# Patient Record
Sex: Male | Born: 1975 | Race: White | Hispanic: No | Marital: Single
Health system: Southern US, Community
[De-identification: ages and names within clinical notes are randomized; demographics above are authoritative.]

## PROBLEM LIST (undated history)

## (undated) ENCOUNTER — Emergency Department (HOSPITAL_COMMUNITY): Discharge status: Against Medical Advice | Payer: Medicaid Other

## (undated) DIAGNOSIS — R569 Unspecified convulsions: Secondary | ICD-10-CM

## (undated) DIAGNOSIS — E079 Disorder of thyroid, unspecified: Secondary | ICD-10-CM

## (undated) DIAGNOSIS — E785 Hyperlipidemia, unspecified: Secondary | ICD-10-CM

## (undated) DIAGNOSIS — M25519 Pain in unspecified shoulder: Secondary | ICD-10-CM

## (undated) DIAGNOSIS — F419 Anxiety disorder, unspecified: Secondary | ICD-10-CM

## (undated) DIAGNOSIS — M549 Dorsalgia, unspecified: Secondary | ICD-10-CM

## (undated) DIAGNOSIS — G8929 Other chronic pain: Secondary | ICD-10-CM

## (undated) DIAGNOSIS — F1910 Other psychoactive substance abuse, uncomplicated: Secondary | ICD-10-CM

## (undated) DIAGNOSIS — Z765 Malingerer [conscious simulation]: Secondary | ICD-10-CM

## (undated) DIAGNOSIS — E039 Hypothyroidism, unspecified: Secondary | ICD-10-CM

## (undated) DIAGNOSIS — F445 Conversion disorder with seizures or convulsions: Secondary | ICD-10-CM

## (undated) DIAGNOSIS — Z9289 Personal history of other medical treatment: Secondary | ICD-10-CM

## (undated) DIAGNOSIS — G40909 Epilepsy, unspecified, not intractable, without status epilepticus: Secondary | ICD-10-CM

---

## 1997-12-17 ENCOUNTER — Emergency Department (HOSPITAL_COMMUNITY): Admission: EM | Admit: 1997-12-17 | Discharge: 1997-12-17 | Payer: Self-pay | Admitting: Emergency Medicine

## 1997-12-17 ENCOUNTER — Inpatient Hospital Stay (HOSPITAL_COMMUNITY): Admission: EM | Admit: 1997-12-17 | Discharge: 1997-12-17 | Payer: Self-pay | Admitting: Emergency Medicine

## 1999-12-21 ENCOUNTER — Emergency Department (HOSPITAL_COMMUNITY): Admission: EM | Admit: 1999-12-21 | Discharge: 1999-12-21 | Payer: Self-pay | Admitting: Emergency Medicine

## 2000-01-27 ENCOUNTER — Encounter: Payer: Self-pay | Admitting: *Deleted

## 2000-01-27 ENCOUNTER — Inpatient Hospital Stay (HOSPITAL_COMMUNITY): Admission: EM | Admit: 2000-01-27 | Discharge: 2000-01-28 | Payer: Self-pay | Admitting: *Deleted

## 2000-02-24 ENCOUNTER — Emergency Department (HOSPITAL_COMMUNITY): Admission: EM | Admit: 2000-02-24 | Discharge: 2000-02-24 | Payer: Self-pay | Admitting: Emergency Medicine

## 2000-02-25 ENCOUNTER — Emergency Department (HOSPITAL_COMMUNITY): Admission: EM | Admit: 2000-02-25 | Discharge: 2000-02-25 | Payer: Self-pay | Admitting: Emergency Medicine

## 2000-02-25 ENCOUNTER — Encounter: Payer: Self-pay | Admitting: Emergency Medicine

## 2000-02-26 ENCOUNTER — Emergency Department (HOSPITAL_COMMUNITY): Admission: EM | Admit: 2000-02-26 | Discharge: 2000-02-26 | Payer: Self-pay | Admitting: Emergency Medicine

## 2000-02-27 ENCOUNTER — Emergency Department (HOSPITAL_COMMUNITY): Admission: EM | Admit: 2000-02-27 | Discharge: 2000-02-27 | Payer: Self-pay | Admitting: Emergency Medicine

## 2000-02-27 ENCOUNTER — Encounter: Payer: Self-pay | Admitting: Emergency Medicine

## 2000-03-12 ENCOUNTER — Inpatient Hospital Stay (HOSPITAL_COMMUNITY): Admission: EM | Admit: 2000-03-12 | Discharge: 2000-03-15 | Payer: Self-pay | Admitting: Emergency Medicine

## 2000-03-12 ENCOUNTER — Encounter: Payer: Self-pay | Admitting: Emergency Medicine

## 2000-03-13 ENCOUNTER — Encounter: Payer: Self-pay | Admitting: Internal Medicine

## 2000-03-15 ENCOUNTER — Emergency Department (HOSPITAL_COMMUNITY): Admission: EM | Admit: 2000-03-15 | Discharge: 2000-03-16 | Payer: Self-pay | Admitting: Emergency Medicine

## 2000-03-15 ENCOUNTER — Emergency Department (HOSPITAL_COMMUNITY): Admission: EM | Admit: 2000-03-15 | Discharge: 2000-03-15 | Payer: Self-pay | Admitting: Emergency Medicine

## 2000-03-16 ENCOUNTER — Emergency Department (HOSPITAL_COMMUNITY): Admission: EM | Admit: 2000-03-16 | Discharge: 2000-03-16 | Payer: Self-pay | Admitting: Emergency Medicine

## 2000-03-17 ENCOUNTER — Emergency Department (HOSPITAL_COMMUNITY): Admission: EM | Admit: 2000-03-17 | Discharge: 2000-03-17 | Payer: Self-pay | Admitting: Internal Medicine

## 2000-03-20 ENCOUNTER — Inpatient Hospital Stay (HOSPITAL_COMMUNITY): Admission: EM | Admit: 2000-03-20 | Discharge: 2000-03-22 | Payer: Self-pay | Admitting: Emergency Medicine

## 2000-03-20 ENCOUNTER — Emergency Department (HOSPITAL_COMMUNITY): Admission: EM | Admit: 2000-03-20 | Discharge: 2000-03-20 | Payer: Self-pay | Admitting: Emergency Medicine

## 2000-03-22 ENCOUNTER — Emergency Department (HOSPITAL_COMMUNITY): Admission: EM | Admit: 2000-03-22 | Discharge: 2000-03-22 | Payer: Self-pay | Admitting: Emergency Medicine

## 2000-04-04 ENCOUNTER — Emergency Department (HOSPITAL_COMMUNITY): Admission: EM | Admit: 2000-04-04 | Discharge: 2000-04-04 | Payer: Self-pay | Admitting: Emergency Medicine

## 2000-04-29 ENCOUNTER — Emergency Department (HOSPITAL_COMMUNITY): Admission: EM | Admit: 2000-04-29 | Discharge: 2000-04-30 | Payer: Self-pay | Admitting: Emergency Medicine

## 2001-02-20 ENCOUNTER — Encounter: Payer: Self-pay | Admitting: Emergency Medicine

## 2001-02-20 ENCOUNTER — Emergency Department (HOSPITAL_COMMUNITY): Admission: EM | Admit: 2001-02-20 | Discharge: 2001-02-20 | Payer: Self-pay | Admitting: Emergency Medicine

## 2001-04-07 ENCOUNTER — Emergency Department (HOSPITAL_COMMUNITY): Admission: EM | Admit: 2001-04-07 | Discharge: 2001-04-07 | Payer: Self-pay | Admitting: Emergency Medicine

## 2001-04-07 ENCOUNTER — Encounter: Payer: Self-pay | Admitting: Emergency Medicine

## 2002-04-10 ENCOUNTER — Emergency Department (HOSPITAL_COMMUNITY): Admission: EM | Admit: 2002-04-10 | Discharge: 2002-04-10 | Payer: Self-pay | Admitting: Emergency Medicine

## 2002-06-11 ENCOUNTER — Emergency Department (HOSPITAL_COMMUNITY): Admission: EM | Admit: 2002-06-11 | Discharge: 2002-06-11 | Payer: Self-pay | Admitting: Emergency Medicine

## 2002-06-11 ENCOUNTER — Encounter: Payer: Self-pay | Admitting: Emergency Medicine

## 2003-05-21 ENCOUNTER — Inpatient Hospital Stay (HOSPITAL_COMMUNITY): Admission: EM | Admit: 2003-05-21 | Discharge: 2003-05-22 | Payer: Self-pay | Admitting: Psychiatry

## 2003-05-22 ENCOUNTER — Inpatient Hospital Stay (HOSPITAL_COMMUNITY): Admission: EM | Admit: 2003-05-22 | Discharge: 2003-05-23 | Payer: Self-pay | Admitting: Emergency Medicine

## 2003-05-23 ENCOUNTER — Observation Stay (HOSPITAL_COMMUNITY): Admission: EM | Admit: 2003-05-23 | Discharge: 2003-05-23 | Payer: Self-pay | Admitting: Psychiatry

## 2003-05-29 ENCOUNTER — Emergency Department (HOSPITAL_COMMUNITY): Admission: EM | Admit: 2003-05-29 | Discharge: 2003-05-29 | Payer: Self-pay | Admitting: Emergency Medicine

## 2004-03-05 ENCOUNTER — Emergency Department (HOSPITAL_COMMUNITY): Admission: EM | Admit: 2004-03-05 | Discharge: 2004-03-06 | Payer: Self-pay | Admitting: Emergency Medicine

## 2004-03-26 ENCOUNTER — Emergency Department (HOSPITAL_COMMUNITY): Admission: EM | Admit: 2004-03-26 | Discharge: 2004-03-26 | Payer: Self-pay

## 2004-04-06 ENCOUNTER — Emergency Department (HOSPITAL_COMMUNITY): Admission: EM | Admit: 2004-04-06 | Discharge: 2004-04-06 | Payer: Self-pay | Admitting: Emergency Medicine

## 2004-04-19 ENCOUNTER — Emergency Department (HOSPITAL_COMMUNITY): Admission: EM | Admit: 2004-04-19 | Discharge: 2004-04-19 | Payer: Self-pay | Admitting: Emergency Medicine

## 2004-04-28 ENCOUNTER — Emergency Department (HOSPITAL_COMMUNITY): Admission: EM | Admit: 2004-04-28 | Discharge: 2004-04-28 | Payer: Self-pay | Admitting: Emergency Medicine

## 2005-12-02 ENCOUNTER — Emergency Department (HOSPITAL_COMMUNITY): Admission: EM | Admit: 2005-12-02 | Discharge: 2005-12-02 | Payer: Self-pay | Admitting: Emergency Medicine

## 2005-12-16 ENCOUNTER — Emergency Department (HOSPITAL_COMMUNITY): Admission: EM | Admit: 2005-12-16 | Discharge: 2005-12-16 | Payer: Self-pay

## 2006-01-27 ENCOUNTER — Emergency Department (HOSPITAL_COMMUNITY): Admission: EM | Admit: 2006-01-27 | Discharge: 2006-01-27 | Payer: Self-pay | Admitting: Emergency Medicine

## 2006-12-14 ENCOUNTER — Emergency Department (HOSPITAL_COMMUNITY): Admission: EM | Admit: 2006-12-14 | Discharge: 2006-12-15 | Payer: Self-pay | Admitting: Emergency Medicine

## 2006-12-16 ENCOUNTER — Emergency Department: Payer: Self-pay | Admitting: Emergency Medicine

## 2006-12-20 ENCOUNTER — Emergency Department (HOSPITAL_COMMUNITY): Admission: EM | Admit: 2006-12-20 | Discharge: 2006-12-20 | Payer: Self-pay | Admitting: Emergency Medicine

## 2007-03-03 ENCOUNTER — Emergency Department (HOSPITAL_COMMUNITY): Admission: EM | Admit: 2007-03-03 | Discharge: 2007-03-04 | Payer: Self-pay | Admitting: Emergency Medicine

## 2007-08-24 ENCOUNTER — Emergency Department (HOSPITAL_COMMUNITY): Admission: EM | Admit: 2007-08-24 | Discharge: 2007-08-24 | Payer: Self-pay | Admitting: Emergency Medicine

## 2007-09-24 ENCOUNTER — Emergency Department (HOSPITAL_COMMUNITY): Admission: EM | Admit: 2007-09-24 | Discharge: 2007-09-24 | Payer: Self-pay | Admitting: Emergency Medicine

## 2007-09-25 ENCOUNTER — Emergency Department (HOSPITAL_COMMUNITY): Admission: EM | Admit: 2007-09-25 | Discharge: 2007-09-25 | Payer: Self-pay | Admitting: Emergency Medicine

## 2007-11-07 ENCOUNTER — Other Ambulatory Visit: Payer: Self-pay

## 2007-11-08 ENCOUNTER — Observation Stay (HOSPITAL_COMMUNITY): Admission: AD | Admit: 2007-11-08 | Discharge: 2007-11-08 | Payer: Self-pay | Admitting: *Deleted

## 2007-11-08 ENCOUNTER — Inpatient Hospital Stay (HOSPITAL_COMMUNITY): Admission: EM | Admit: 2007-11-08 | Discharge: 2007-11-09 | Payer: Self-pay | Admitting: Emergency Medicine

## 2007-11-08 ENCOUNTER — Ambulatory Visit: Payer: Self-pay | Admitting: *Deleted

## 2007-11-08 ENCOUNTER — Other Ambulatory Visit: Payer: Self-pay | Admitting: Emergency Medicine

## 2007-11-17 ENCOUNTER — Emergency Department (HOSPITAL_COMMUNITY): Admission: EM | Admit: 2007-11-17 | Discharge: 2007-11-17 | Payer: Self-pay | Admitting: Emergency Medicine

## 2007-12-02 ENCOUNTER — Emergency Department (HOSPITAL_COMMUNITY): Admission: EM | Admit: 2007-12-02 | Discharge: 2007-12-02 | Payer: Self-pay | Admitting: Emergency Medicine

## 2007-12-14 ENCOUNTER — Emergency Department (HOSPITAL_COMMUNITY): Admission: EM | Admit: 2007-12-14 | Discharge: 2007-12-14 | Payer: Self-pay | Admitting: Family Medicine

## 2007-12-19 ENCOUNTER — Emergency Department (HOSPITAL_COMMUNITY): Admission: EM | Admit: 2007-12-19 | Discharge: 2007-12-19 | Payer: Self-pay | Admitting: Emergency Medicine

## 2008-02-14 ENCOUNTER — Inpatient Hospital Stay (HOSPITAL_COMMUNITY): Admission: EM | Admit: 2008-02-14 | Discharge: 2008-02-15 | Payer: Self-pay | Admitting: Emergency Medicine

## 2008-02-15 ENCOUNTER — Emergency Department (HOSPITAL_COMMUNITY): Admission: EM | Admit: 2008-02-15 | Discharge: 2008-02-16 | Payer: Self-pay | Admitting: Emergency Medicine

## 2008-02-16 ENCOUNTER — Emergency Department (HOSPITAL_COMMUNITY): Admission: EM | Admit: 2008-02-16 | Discharge: 2008-02-16 | Payer: Self-pay | Admitting: Emergency Medicine

## 2008-02-25 ENCOUNTER — Emergency Department (HOSPITAL_COMMUNITY): Admission: EM | Admit: 2008-02-25 | Discharge: 2008-02-26 | Payer: Self-pay | Admitting: Emergency Medicine

## 2008-02-25 ENCOUNTER — Emergency Department (HOSPITAL_COMMUNITY): Admission: EM | Admit: 2008-02-25 | Discharge: 2008-02-25 | Payer: Self-pay | Admitting: Emergency Medicine

## 2008-04-14 ENCOUNTER — Emergency Department (HOSPITAL_COMMUNITY): Admission: AC | Admit: 2008-04-14 | Discharge: 2008-04-14 | Payer: Self-pay

## 2008-05-02 ENCOUNTER — Emergency Department (HOSPITAL_COMMUNITY): Admission: EM | Admit: 2008-05-02 | Discharge: 2008-05-02 | Payer: Self-pay | Admitting: Emergency Medicine

## 2008-05-07 ENCOUNTER — Emergency Department (HOSPITAL_COMMUNITY): Admission: EM | Admit: 2008-05-07 | Discharge: 2008-05-07 | Payer: Self-pay | Admitting: Emergency Medicine

## 2009-11-27 ENCOUNTER — Emergency Department (HOSPITAL_COMMUNITY): Admission: EM | Admit: 2009-11-27 | Discharge: 2009-11-27 | Payer: Self-pay | Admitting: Emergency Medicine

## 2009-12-15 ENCOUNTER — Encounter: Payer: Self-pay | Admitting: Physician Assistant

## 2009-12-16 ENCOUNTER — Ambulatory Visit: Payer: Self-pay | Admitting: Cardiology

## 2009-12-16 ENCOUNTER — Encounter: Payer: Self-pay | Admitting: Physician Assistant

## 2009-12-17 ENCOUNTER — Encounter: Payer: Self-pay | Admitting: Physician Assistant

## 2009-12-24 ENCOUNTER — Telehealth (INDEPENDENT_AMBULATORY_CARE_PROVIDER_SITE_OTHER): Payer: Self-pay | Admitting: *Deleted

## 2010-01-07 ENCOUNTER — Encounter: Payer: Self-pay | Admitting: Physician Assistant

## 2010-02-01 ENCOUNTER — Emergency Department (HOSPITAL_COMMUNITY): Admission: EM | Admit: 2010-02-01 | Discharge: 2010-02-01 | Payer: Self-pay | Admitting: Emergency Medicine

## 2010-02-11 ENCOUNTER — Ambulatory Visit: Payer: Self-pay | Admitting: Cardiology

## 2010-06-18 ENCOUNTER — Emergency Department (HOSPITAL_COMMUNITY): Admission: EM | Admit: 2010-06-18 | Discharge: 2010-01-05 | Payer: Self-pay | Admitting: Emergency Medicine

## 2010-08-01 ENCOUNTER — Encounter: Payer: Self-pay | Admitting: Emergency Medicine

## 2010-08-02 ENCOUNTER — Encounter: Payer: Self-pay | Admitting: Emergency Medicine

## 2010-08-11 NOTE — Letter (Signed)
Summary: [facility] D/C DR. Bea Laura  [facility] D/C DR. GAIL GERENA   Imported By: Zachary George 01/07/2010 09:02:34  _____________________________________________________________________  External Attachment:    Type:   Image     Comment:   External Document

## 2010-08-11 NOTE — Letter (Signed)
Summary: Appointment -missed  [facility] at [facility] [address]   [place], [place] [address]   Phone: [phone]  Fax: [phone]     January 07, 2010 MRN: [id number]     CRIT OBREMSKI [address] [place], [place]  [address]     Dear Mr. SINKFIELD,  Our records indicate you missed your appointment on January 07, 2010                        with GENE Shayla Heming .   It is very important that we reach you to reschedule this appointment. We look forward to participating in your health care needs.   Please contact us at the number listed above at your earliest convenience to reschedule this appointment.   Sincerely,    Glass blower/designer

## 2010-08-11 NOTE — Consult Note (Signed)
Summary: CARDIOLOGY CONSULT/ [facility]  CARDIOLOGY CONSULT/ [facility]   Imported By: Zachary George 12/24/2009 09:18:58  _____________________________________________________________________  External Attachment:    Type:   Image     Comment:   External Document

## 2010-08-11 NOTE — Progress Notes (Signed)
Summary: Chest pain  Phone Note Call from Patient   Summary of Call: Pt called the office c/o chest pain and numbness down his arms. Pt has never been seen by a provider in our office. However, pt states he saw Dr. Earnestine Leys while in the hospital at [facility]. Pt notified he should go to the ER for current c/o chest pain. Pt states they aren't going to do anything. He states he was just in the hospital and Dr. Earnestine Leys told him he wanted to work with him in the office regarding his pain. Pt states his d/c paper from [facility] has our office phone number on it and pt was to contact our office for an appt. He states he was d/c'd 4-5 days ago but has not called for an appt until now. Pt notified first available with Dr. Earnestine Leys is July 18th. Pt states "I guess if I die before then that's okay." Pt notified he has been instructed by nurse during this call to go to the ER for evaluation of chest pain but refused. Pt states he has good Medicare or Medicaid insurance. Pt notified his type of insurance does not make a difference as to when he is seen in the office. Pt states "well it did with my Dad." Pt again notified type of insurance is not a determining factor when appt are scheduled. Pt stated several times during the converstation that he had been seen at 3 different hospitals and that [facility] d/c'd him and told him to come back if he has recurrent pain. He also stated he didn't even know for sure that it's his heart because someone told him with all of his pain that it might be an ulcer or something.   Pt scheduled with PA on day Dr. Earnestine Leys will be in the office, as well. This appt is set for January 07, 2010 at 2pm. Pt is again instructed to go to the ER for evaluation of chest pain, arm numbness or other problems before this appt date. Pt verbalized understanding. Initial call taken by: Cyril Loosen, RN, BSN,  December 24, 2009 9:38 AM  Follow-up for Phone Call        Agree Follow-up by: Lewayne Bunting, MD, [facility],   December 24, 2009 2:13 PM

## 2010-11-24 NOTE — Consult Note (Signed)
[name], [name]              ACCOUNT NO.:  [id number]   MEDICAL RECORD NO.:  [id number]          PATIENT TYPE:  INP   LOCATION:  3021                         FACILITY:  [facility]   PHYSICIAN:  Pramod P. Pearlean Brownie, MD    DATE OF BIRTH:  October 11, 1975   DATE OF CONSULTATION:  02/13/2008  DATE OF DISCHARGE:                                 CONSULTATION   REFERRING PHYSICIAN:  Lonia Blood, MD   REASON FOR REFERRAL:  Seizures.   HISTORY OF PRESENT ILLNESS:  [name] is a 35 year old Caucasian male  who was apparently traveling from [place] to [place] for a  funeral.  Apparently, the patient had forgot to take his home  medications, which include phenobarbital 30 mg twice a day, Xanax, and  Vicodin and left it at home in [place] 3 days ago.  He arrived at [facility] Emergency Room with having possible seizures.  He was described as  being speaking, despite having shaking of all four extremities without  any postictal sequelae.  Following these brief episodes, he was found to  be inappropriate towards staff.  The patient's phenobarbital level on  admission was found to be less than 5.  Urine drug screen was negative.  At times, he has stated to the nurses that he had not taken  phenobarbital for more than 3 years, but today he tells me he took it  past 3 days ago.  He was given in the ER, but he told to staff that  Ativan is not enough for his seizures.  He also demanded Xanax.  When  the patient has been asked multiple times by the nursing staff to  provide details about his Pharmacy where he lives and his physicians, he  has not been able to say that.  This morning, he is awake and  interactive, he is not very cooperative with exam, tries to keep his  head covered and sleep.  He can respond briefly appropriately.   PAST MEDICAL HISTORY:  Apparently, positive for seizures, but this  cannot be confirmed.  Back pain.   HOME MEDICATIONS:  1. Xanax 1 mg 4 times a day.  2.  Phenobarbital 30 mg twice a day.  3. Vicodin 10/325 two or three a day.  4. Synthroid 75 mcg a day.  5. Claritin 10 mg a day.   MEDICATIONS ALLERGIES LISTED:  DILANTIN, THORAZINE, TORADOL, and HALDOL.   REVIEW OF SYSTEMS:  A 12-system review of systems documented separately  in the chart positive for some joint pain, stiffness, anxiety, and  possible seizure versus pseudoseizure.   SOCIAL HISTORY:  The patient apparently lives in [place].  He does not  smoke or drink.   PHYSICAL EXAMINATION:  A young Caucasian male who is uncooperative.  He  is lying comfortably in bed with the blanket covering his head.  He  briefly responds and follows few commands and then pulls the blanket  over himself again.  I tried to direct him multiple times to concentrate  and let me examine him, but has not been very cooperative.  He seems  awake,  alert, and interactive.  There is no aphasia or apraxia.  His eye  movements are full range.  There is no nystagmus.  He can move all 4  extremities equally well against gravity.  There appears to be no focal  weakness.  Sensation and coordination and gait were not tested.   DATA:  Reviewed.   LABORATORY DATA:  On admission, WBC count is borderline at 11.2.  Hemoglobin, hematocrit, and platelet count is normal.  Basic chemistries  are normal.  Urine drug screen is negative.  Phenobarbital level is less  than 10.  Alcohol level is not detectable.   IMPRESSION:  A 31-year gentleman with supposed history of seizures, on  phenobarbital who has had some involuntary movements yesterday.  The  description of behavior after the episode is not suggestive of seizures  and probably is non-organic in nature.  The patient is definitely  demonstrating some drug-seeking behavior.   PLAN:  I would recommend putting him back on his home phenobarbital dose  of 30 mg twice a day.  I do not believe loading him with phenobarbital  or any further testing is necessary.  The  patient was scheduled to have  an EEG, but he refused to have this done today.  No further neurological  testing is necessary.  He may follow up with his primary doctor or a  neurologist if he has any in [place] in the future.   Thank you for this referral.           ______________________________  Sunny Schlein. Pearlean Brownie, MD     PPS/MEDQ  D:  02/14/2008  T:  02/15/2008  Job:  [id number]

## 2010-11-24 NOTE — Discharge Summary (Signed)
[name], [name] NO.:  [id number]   MEDICAL RECORD NO.:  [id number]          PATIENT TYPE:  IPS   LOCATION:  0303                          [facility]ILITY[facility]  [facility]   PHYSICIAN:  Jasmine Pang, M.D. DATE OF BIRTH:  1975/07/18   DATE OF ADMISSION:  11/08/2007  DATE OF DISCHARGE:  11/09/2007                               DISCHARGE SUMMARY   IDENTIFICATION:  This is a 35 year old single white male who was  admitted on a voluntary basis on November 08, 2007.   HISTORY OF PRESENT ILLNESS:  The patient presented to the emergency room  requesting detox for opiates.  He says that he became addicted 6 years  ago after falling off a scaffold.  Percocet and various opiates were  prescribed for several years by his primary care physician.  Currently,  he has been getting them off the street.  He says he been taking between  20 and 30 tablets of Percocet daily.  He has also been drinking a 12-  pack of alcohol daily for the past month and is taking Xanax 1 mg 4  times a day.  He admits to being heavily addicted for the past 4-5  years.  He also admits to occasional cocaine.  His alcohol level was  less than 5.  Urine drug screen was positive for benzodiazepines,  barbiturates, and opiates.  He denies any suicidal thoughts.   PAST PSYCHIATRIC HISTORY:  This is the patient's second [facility] admission.  He has a history of being admitted here about 5 years ago for agitation  and opiate abuse.  He also has a history of prior admissions to [facility] at least once also for substance abuse and agitated  behavior.   FAMILY HISTORY:  The patient denies any family history of substance  abuse or mental illness.   MEDICAL PROBLEMS:  The patient reports history of a seizure in the past,  but is not currently being treated for seizures.  He has a history of  chronic back pain and hypothyroidism.   CURRENT MEDICATIONS:  1. Synthroid 75 mcg daily.  2. Xanax 1 mg q.i.d.  3. Claritin  as needed for allergies.   DRUG ALLERGIES:  GEODON, TORADOL, HALDOL, and THORAZINE.  All of which  cause difficulty breathing.   PHYSICAL FINDINGS:  This was done in the emergency room.  The patient  was found to have no acute physical or medical problems.   DIAGNOSTIC STUDIES:  WBC is 7.4, hemoglobin 13.1, hematocrit 37.1,  platelets 226,000.  Chemistries revealed sodium of 139, potassium of  3.8, chloride of 107, carbon dioxide 24, BUN 13, creatinine 0.97, and  random glucose 128.  Liver enzymes were within normal limits.  Urine  drug screen was positive for opiates, benzodiazepines, and barbiturates  and the alcohol level was less than 5.   HOSPITAL COURSE:  Upon admission, the patient was continued on Synthroid  75 mcg daily and Claritin 10 mg q.a.m.  He was also started on the  Librium detox protocol and clonidine detox protocol.  He was started on  nicotine patch 21  mg q. day.  In session with me, the patient was  friendly and cooperative.  He admitted to 200 dollars a day worth of  crack cocaine, 18 beers a day, hydrocodone, and Xanax.  He denies  suicidal or homicidal ideation, but admitted to depression.  He had a  4th grade education and states he has difficulty reading.  He has a  history of seizures when detoxing.  His last seizure was 18 months ago.  He complained of constipation.  On November 09, 2007, the patient had to be  transferred to the [facility] and was discharged from our  unit.   DISCHARGE DIAGNOSES:  Axis I:  Polysubstance dependence.  Axis II:  None.  Axis III:  Chronic low back pain, hypothyroidism, seizure disorder.  Axis IV:  Severe (issues with social isolation, burden of psychiatric  illness, burden of medical illness).  Axis V:  Global assessment of functioning was 46 upon discharge.  GAF  was 46 upon admission.  GAF highest past year was 60-65.   DISCHARGE PLANS:  There was no specific activity levels or dietary  restrictions.    POSTHOSPITAL CARE PLANS:  The patient was transferred to [facility] ED  where he was admitted in to [facility].   DISCHARGE MEDICATIONS:  1. Synthroid 75 mcg daily.  2. Claritin 10 mg p.o. q.a.m.      Jasmine Pang, M.D.  Electronically Signed     BHS/MEDQ  D:  12/13/2007  T:  12/13/2007  Job:  [id number]

## 2010-11-24 NOTE — Discharge Summary (Signed)
[name], [name]              ACCOUNT NO.:  [id number]   MEDICAL RECORD NO.:  [id number]          PATIENT TYPE:  INP   LOCATION:  1508                         [facility]ACILITY:  [facility]   PHYSICIAN:  Ladell Pier, M.D.   DATE OF BIRTH:  [birth year]   DATE OF ADMISSION:  11/08/2007  DATE OF DISCHARGE:  11/09/2007                               DISCHARGE SUMMARY   REFERRING PHYSICIAN:  [facility].   He was being admitted for pseudoseizures versus seizures.  He had an EEG  done.  He was on phenobarbital for alcohol withdrawal and to treat  possible seizures.  Patient had the EEG done.  Then he left AMA, went  back to behavioral health.  Patient has the mental capacity to make  medical decisions.  His vital signs were stable on discharge.  Alcohol  level less than 5.  Patient was evaluated by Neurology and also by  Psychiatry Dr. Doristine Counter.      Ladell Pier, M.D.  Electronically Signed     NJ/MEDQ  D:  11/09/2007  T:  11/09/2007  Job:  [id number]

## 2010-11-24 NOTE — Consult Note (Signed)
NAMEDERREON, CONSALVO NO.:  [id number]   MEDICAL RECORD NO.:  [id number]          PATIENT TYPE:  EMS   LOCATION:  URG                          FACILITY:  [facility]   PHYSICIAN:  Antonietta Breach, M.D.  DATE OF BIRTH:  August 15, 1975   DATE OF CONSULTATION:  DATE OF DISCHARGE:  12/19/2007                                 CONSULTATION   REASON FOR CONSULTATION:  Delirium and homicidal ideation   HISTORY OF PRESENT ILLNESS:  [name] is a 35 year old male  admitted to the [facility] on February 13, 2008 with seizure.   After his seizure, he was very agitated and was threatening to kill  people.  He had impaired judgment and clouding of consciousness.   By today, [name] has a clear sensorium.  He is not having any  thoughts of harming himself or others.  He has no hallucinations or  delusions.  His memory and orientation function are intact.   He states that he has been taking Xanax to control anxiety.  He also  acknowledges that he has been noncompliant with his seizure medications.  He has strong motivation now to stay on his anti-seizure medication.   PAST PSYCHIATRIC HISTORY:  In review of the past medical record under a  different medical record number, the record does confirm that Mr.  Horne has undergone a psychiatric admission at the [facility].  There he was assessed in April 2009 to have alcohol  abuse versus dependence and polysubstance abuse.  He was not discharged  on any medications.   FAMILY PSYCHIATRIC HISTORY:  None known.   PAST MEDICAL HISTORY:  [name] is currently in the neurologic  intensive care unit at [facility] status post seizure.  His  delirium has resolved.  He does have a history of seizure disorder,  hypothyroidism, chronic pain syndrome.   ALLERGIES:  HALDOL, THORAZINE, KETOROLAC and DILANTIN.   He is currently on Xanax 1 mg q.i.d. which he has been taking  chronically as an  outpatient.   MENTAL STATUS EXAM:  [name] is alert.  He is oriented to all  spheres.  His eye contact is good.  He has normal attention,  concentration within normal limits.  Memory is intact to immediate,  recent and remote except for the peri-ictal period.  His mood and affect  are normal.  Speech is within normal limits.  Thought process logical,  coherent, goal-directed.  No looseness of associations or thought  content.  No thoughts of harming himself.  No thoughts of harming  others.  No delusions, no hallucinations.  Insight is intact.  Judgment  is intact.   ASSESSMENT:  AXIS I:  Delirium not otherwise specified, now resolved.  Polysubstance abuse versus dependence.  Rule out 293.84 anxiety disorder  not otherwise specified.  AXIS II:  Deferred.  AXIS III:  See past medical history.  AXIS IV:  Primary support group.  AXIS V:  55.   [name] is not at risk to harm himself or others.  He agrees to call  emergency services  immediately for any thoughts of harming himself,  thoughts of harming others or other emergency symptoms.   The undersigned provided ego supportive psychotherapy and education,  including information on outpatient psychiatric care.   In addition to chemical dependence rehabilitation and 12-step groups,  the patient could be a candidate for an SSRI such as Celexa which would  eventually decrease his need for Xanax for anti-anxiety.   He also is a potential candidate for cognitive behavioral therapy.   [name] is psychiatrically cleared for discharge.   However, he did want to undergo a benzodiazepine withdrawal protocol  that could be performed on a general medical unit.   [facility] in town will not allow a direct inpatient transfer.  Therefore, if the patient wanted to be detoxified at [facility], he would  run the risk of going into withdrawal between discharge here and the  [facility] appointment.   They will take people for detox coming  in off the street.  Once they are  detoxed, they can proceed with the chemical dependency psychosocial  rehabilitation residential program at [facility].   If [name] wanted to pursue a benzodiazepine withdrawal protocol, it  could be conducted on a general medical ward followed by an outpatient  intake appointment at [facility] for the psychosocial chemical dependency  residential tract.   Without a significant dual diagnosis on AXIS I, he would not be a  candidate for a detox protocol at the [facility] most  likely.  However, the case manager could pursue this if [name] is  motivated.   Regardless, he does have the capacity to leave the hospital.      Antonietta Breach, M.D.  Electronically Signed     JW/MEDQ  D:  02/14/2008  T:  02/14/2008  Job:  [id number]

## 2010-11-24 NOTE — Discharge Summary (Signed)
[name], LANUM              ACCOUNT NO.:  [id number]   MEDICAL RECORD NO.:  [id number]          PATIENT TYPE:  EMS   LOCATION:  ED                           FACILITY:  [facility]   PHYSICIAN:  Margaret A. Scott, N.P.DATE OF BIRTH:  [birth year]   DATE OF ADMISSION:  11/08/2007  DATE OF DISCHARGE:  11/08/2007                               DISCHARGE SUMMARY   IDENTIFYING INFORMATION:  This is a 35 year old white male who is  single.  This was a voluntary admission.   HISTORY OF PRESENT ILLNESS:  Please refer to the psychiatric admission  assessment dictated on November 08, 2007. Verlene Mayer presented to the emergency  room requesting detox from opiates. Reported that he had been addicted  for about 6 years after starting on opiates for back pain after falling  off of a scaffold.  Please refer to the dictated admission assessment.  The patient was initially started on clonidine and Librium protocol for  polysubstance abuse and an initial AXIS I diagnosis of alcohol abuse,  rule out dependence and opiate abuse and dependence and polysubstance  abuse. On the afternoon of admission he was found on the floor,  tremulous. Vital signs were normal.  He was transferred to the emergency  room to rule out possible seizure disorder and at that time was  discharged from [facility].  He did not return to our  psychiatric unit.   DISCHARGE DIAGNOSES:  AXIS I: Alcohol abuse, rule out dependence, opiate  abuse and dependence.  Polysubstance abuse.  AXIS II: Deferred.  AXIS III: Chronic low back pain and hypothyroidism.  AXIS IV: Severe issues with social isolation, but having a father who is  supportive and a stable home was an asset.  AXIS V: Current 46. Past year not known.      Margaret A. Lorin Picket, N.P.     MAS/MEDQ  D:  11/17/2007  T:  11/17/2007  Job:  [id number]

## 2010-11-24 NOTE — H&P (Signed)
[name], TURNEY NO.:  [id number]   MEDICAL RECORD NO.:  [id number]          PATIENT TYPE:  IPS   LOCATION:  0303                          FACILITY:  [facility]   PHYSICIAN:  Jasmine Pang, M.D. DATE OF BIRTH:  1975-11-02   DATE OF ADMISSION:  11/08/2007  DATE OF DISCHARGE:                       PSYCHIATRIC ADMISSION ASSESSMENT   IDENTIFYING INFORMATION:  A 35 year old white male who is single.  This  is a voluntary admission.   HISTORY OF PRESENT ILLNESS:  This patient presented to the emergency  room requesting detox from opiates.  Says that he became addicted 6  years ago after falling off of a scaffold.  Percocet and various opiates  were prescribed for several years by his primary care physician, but  currently he has been getting them off the street.  Says that he has  been taking between 20 and 30 tablets of Percocet daily.  Also drinking  a 12-pack of alcohol daily for the past month and is taking Xanax 1 mg  four times a day.  He admits to being a heavily addicted for the past 4-  5 years.  Also, admits to occasional cocaine.  His alcohol level was  less than five.  Urine drug screen positive for benzodiazepines,  barbiturates and opiates.  He denies any suicidal thoughts.   PAST PSYCHIATRIC HISTORY:  This the patient's second [facility] admission.  He  has history of being admitted here about 5 years ago for agitation and  opiate abuse.  He also has a history of prior admissions to [facility].  At least once also for substance abuse and agitated behavior.   SOCIAL HISTORY:  Unemployed white male who is single.  Living in a  [place], [place], unemployed.  He cites that his father is his  primary support person and he is close to him.  Also helps him with  medications and problems and is his support through his substance abuse  issues.   FAMILY HISTORY:  He denies a family history of substance abuse or mental  illness.   MEDICAL HISTORY:   The patient is followed by Dr. Elpidio Anis, MD in  [place], [place].  Not clear when he was last seen there.  The  patient reports a past history of seizure in the past, but is not  currently being treated for seizure.  Has a history of a chronic back  pain and hypothyroidism.   CURRENT MEDICATIONS:  1. Synthroid 75 mcg daily.  2. Xanax 1 mg p.o. q.i.d.  3. Claritin as needed for allergies.  4. Currently, non-prescribed opiates.   DRUG ALLERGIES:  GEODON, TORADOL, HALDOL AND THORAZINE, ALL WHICH CAUSE  DIFFICULTY BREATHING.   PHYSICAL EXAMINATION:  GENERAL:  Done in the emergency room, this is a  stocky built gentleman in no acute distress.  VITAL SIGNS:  Height 5 feet 9 inches tall, 223 pounds, temperature 97.5,  pulse 81, respirations 16, blood pressure 112/54.  Physical exam was  documented in the emergency room by Dr. Nelva Nay.   DIAGNOSTIC STUDIES:  CBC:  WBC 7.4, hemoglobin 213.1, hematocrit  37.1  and platelets 226,000.  Chemistry:  Sodium 139, potassium 3.8, chloride  107, carbon dioxide 24, BUN 13, creatinine 0.97 and random glucose 128.  Liver enzymes SGOT 17, SGPT 19.  Alkaline phosphatase and total  bilirubin within normal limits.  Urine drug screen was positive for  opiates benzodiazepines and barbiturates and alcohol level was less than  five.   MENTAL STATUS EXAM:  Sleepy white male, but cooperative, I have awakened  him from a morning nap.  He does arouse easily, a little bit sleepy, but  is coherent, oriented x3.  He is requesting help to get off drugs. Says  he has been using them pretty heavily.  His father is a help to him and  is supportive.  He has been unemployed for some time.  Speech is normal.  Mood is neutral today, denying any suicidal thoughts or mood problems.  Oriented x3.  No signs of delirium or confusion.  Denying any dangerous  thoughts.  Willing to work with [place] here over the next 2-4 days to get  off the drugs and alcohol.    DIAGNOSES:  AXIS I:  Alcohol abuse rule out dependence.  Opiate abuse  and dependence, polysubstance abuse.  AXIS II:  Deferred.  AXIS III:  Chronic low back pain, hypothyroidism.  AXIS IV:  Severe issues with social isolation having a father who is  supportive and a stable home situation is an asset to him.  AXIS V:  Current is 44, past year not known.   PLAN:  Voluntarily admit the patient to detox him from substances.  We  started him on both the clonidine and a Librium protocol with a goal of  a safe detox within 5 days.  He will receive Librium 25 mg q.i.d. in a  taper down fashion and also clonidine protocol for the opiate withdrawal  along with additional medications for withdrawal symptoms and a  multivitamin daily, thiamine 100 mg daily.  We hope to get his father  involved in treatment and he has voiced his agreement with the plan.  Estimated length of stay is 5 days.      Margaret A. Lorin Picket, N.P.      Jasmine Pang, M.D.  Electronically Signed    MAS/MEDQ  D:  11/08/2007  T:  11/08/2007  Job:  [id number]

## 2010-11-24 NOTE — Consult Note (Signed)
[name], GARSKE NO.:  [id number]   MEDICAL RECORD NO.:  [id number]          PATIENT TYPE:  INP   LOCATION:  1508                         FACILITY:  [facility]   PHYSICIAN:  Anselm Jungling, MD  DATE OF BIRTH:  1976/03/04   DATE OF CONSULTATION:  11/09/2007  DATE OF DISCHARGE:                                 CONSULTATION   IDENTIFYING DATA AND REASON FOR REFERRAL:  The patient is a 35 year old  Caucasian male who was initially admitted to the inpatient psychiatry  service after he presented with a request for alcohol and drug  detoxification.  He admitted to alcohol and cocaine abuse.  Shortly  after arriving there, he began to have seizure-like activity, and was  subsequently transferred to [facility] for further evaluation  and treatment.  At this time, the patient is wanting to be discharged,  and threatening to leave AMA.   HISTORY OF PRESENTING PROBLEMS:  The patient has a long and complex  history, all of which I am not able to access at the moment because it  is apparently spread over several different medical record numbers  within our system, and at present I am only able to access the current  one.  However, the patient apparently has a long history of  polysubstance abuse, alcohol abuse, and some form of seizure disorder  that may actually represent true seizure disorder, but also may  represent pseudoseizures or some combination there of.   At present, he is being treated with a phenobarbital regimen of 30 mg  q.i.d. and he is on seizure precautions.  The nursing staff tell me that  throughout this morning, he has had seizure-like activity on and off,  but does not necessarily appear that he is having what would conform  morphologically to typical grand mal seizures.   MENTAL STATUS AND OBSERVATIONS:  The patient is a well-nourished,  normally-developed adult male who I interviewed in his hospital room.  He was lying in bed, awake,  watching television, with the side rails up  and padding in place.  He is alert and fully oriented.  However, he  tells me that he thinks he was transferred over here to [facility] about 3 days ago, when it was actually yesterday.  Still he  does not appear to be confused or on the surface to have any cognitive  or memory impairment.  His thoughts and speech are normally organized.  There is nothing to suggest any underlying formal thought disorder,  cognitive or memory impairment or psychosis.  He makes no frankly  delusional statements.   I asked him how he is feeling about his stay here, and he stated they  are not doing nothing for me.  When I asked him why he had been talking  leaving against medical advice, he stated that it was because he did not  feel that they were treating his symptoms adequately, and he wanted to  leave so that he could go to the emergency department and get more  definitive help.  I asked him what specifically he  wanted help with.  He  talked about headache, and shaking.  I asked him what he thought the  emergency room with to that is not being been here, and he stated that  he did not know.  At one point, I told him in a semi-joking way, that I  thought he was unhappy because he was not getting what you want them to  do for you here.  He responded by smiling and laughing at this, and  then quickly denying it.   I made it clear to him that his physicians here were doing everything  possible for him.  I also reminded him that he is after all going  through alcohol detoxification, and it is common to have symptoms such  as headache, in generally not feeling well, and that seizures are common  in alcohol withdrawal as well.  I reminded him that he has a history of  seizure disorder, and this combined with alcohol abuse means that he is  a very likely candidate for seizure activity, which is currently being  treated appropriately here.  He basically  agree with all that I was  saying to him.  I made it clear to him that if he were to leave against  medical advice, that he would not receive any form of different care,  evaluation or medication treatment in the emergency department.  He  indicated that he understood this.  I told him that I thought it was in  his best interest to remain here and allow the doctors that are caring  for him to complete their diagnostic procedures, and to manage his  alcohol detoxification.  I told him that when this is completed, that we  can look at the advisability of him returning to inpatient psychiatric  unit.  He accepted this and agreed with it.   IMPRESSION:  AXIS I:  Alcohol dependence, cocaine abuse. Alcohol  withdrawal.  AXIS II:  Personality disorder NOS.  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  AXIS IV:  Stressors severe.  AXIS V:  GAF 45.   RECOMMENDATIONS:  At this time,  I feel that he is being managed as well  as possible here on the psychiatric medical unit of [facility].  I believe that his medical management is appropriate with  respect to his treatment with phenobarbital.  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  Although, with his history of alcohol dependence and  being in a phase of alcohol withdrawal, he is at risk for true seizures.  We must bear in mind that many individuals with real seizure disorders  and epilepsy also have, at times, pseudoseizures mixed in.   The patient did request something to help him sleep, and I have ordered  Seroquel 100 mg q.h.s..   Please do not hesitate to contact me if I can be of further service with  this challenging patient.      Anselm Jungling, MD  Electronically Signed     SPB/MEDQ  D:  11/09/2007  T:  11/09/2007  Job:  [id number]

## 2010-11-24 NOTE — H&P (Signed)
[name], [name] NO.:  [id number]   MEDICAL RECORD NO.:  [id number]          PATIENT TYPE:  INP   LOCATION:  3002                         FACILITY:  [facility]   PHYSICIAN:  Lucita Ferrara, MD         DATE OF BIRTH:  02-05-1976   DATE OF ADMISSION:  02/13/2008  DATE OF DISCHARGE:                              HISTORY & PHYSICAL   PRIMARY CARE PHYSICIAN:  Primary care doctor is unassigned; the patient  is from [place].   CHIEF COMPLAINT AND HISTORY OF THE PRESENT ILLNESS:  The patient is a 1-  year-old who presents to [facility] with intractable  seizures.  The patient apparently is here in this area to attend a  funeral and will be staying here for 5 days per the patient.  The  patient states that he forgot to bring his medications with him  including Xanax, hydrocodone, and phenobarbital.  The last dosage of  these medications were about 20 hours ago.  He feels very shaky.  He  called his physician in [place] who advised him to come to an emergency  room.  He  had several witnessed seizures here.  He had a seizure in the  triage area and several other ones in the in the emergency room here.  He has received a total of five separate doses of Ativan to stop his  seizures.  His seizure episodes are not resulting in the postictal or  confusional state thereafter.  He denies any head trauma, fevers,  chills, neck pain, and blurry vision.  He also has no urinary or stool  incontinence, and there is no tongue biting that is noticed.  After the  seizure the patient complains of back pain and is asking for pain  medications.   PAST MEDICAL HISTORY:  The past medical history is significant for.  1. Anxiety.  2. Seizure disorder.  3. Hypothyroidism.  4. Chronic pain syndrome.  5. Anxiety and depression.   SOCIAL HISTORY:  The patient denies drugs and alcohol; but, currently  smokes about two packs per day.   ALLERGIES:  1. HALDOL.  2. THORAZINE.  3.  TORADOL.  4. DILANTIN.   MEDICATIONS:  The patient's home medications include:  1. Hydrocodone/acetaminophen 10/650, 4 times daily.  2. Synthroid; unknown dose once a day.  3. Xanax 1 mg four times a day.  4. Phenobarbital 60 mg twice a day.   REVIEW OF SYSTEMS:  The review of systems is as per that in the HPI and  is otherwise negative.   PHYSICAL EXAMINATION:  GENERAL APPEARANCE:  Generally speaking the  patient is an anxious morbidly obese male __________  in the bed.  VITAL SIGNS:  The patient's blood pressure is 135/75, pulse is 92,  respirations are 18, temperature is 97.3, pulse ox is 97% on 2 liters  nasal cannula.  HEENT AND NECK:  Head is normocephalic and atraumatic.  Eyes; sclerae  are anicteric.  The neck is supple.  No JVD.  No carotid bruits.  PERRLA.  Extraocular muscles are intact.  HEART:  Cardiovascular -  S1 and S2, and regular rate and rhythm.  No  murmurs, rubs or clicks.  LUNGS:  The lungs are clear to auscultation bilaterally.  No rhonchi,  rales or wheezes.  ABDOMEN:  The abdomen is obese, soft, nontender and nondistended with  positive bowel sounds.  EXTREMITIES:  The extremities reveal no clubbing, cyanosis or edema.  NEUROLOGIC EXAMINATION:  On neuro exam the patient is alert and oriented  x3.  Cranial nerves II-XII are grossly intact.  Speech intact.  PSYCHIATRIC:  On psychiatric exam the patient looks anxious.   EMERGENCY DEPARTMENT COURSE:  Neurology was consulted.  Discussion was  held with Dr. 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  The patient received  several doses of Ativan and morphine per neurology's recommendations.  The patient also received 1 gram of IV Keppra.  Currently he has no  active seizures.  The EKG showed nonspecific ST-T wave changes.  The  patient had an aspirin level and his phenobarbital level was  subtherapeutic, and less than 5.  Salicylate level was less than  4.  Acetaminophen level was less than 10.  Blood alcohol level was less than  5.  A complete metabolic panel was within normal limits.  CBC was  normal.  .   ASSESSMENT/PLAN:  This is a 35 year old with:  1. Seizure versus pseudoseizure.  2. Chronic back pain.  3. History of anxiety and depression.  4. Noncompliance with medical care.   DISCUSSION AND PLAN:  The differential diagnosis remains at seizures  versus pseudoseizures versus benzodiazepine or opiate withdrawal.  It is  unlikely that these are seizures given there is no postictal phase, no  confusional state nor loss of bowel or bladder control.  My plan would  be to admit the patient to the medical telemetry floor.  We will  continue neurological checks.  The patient was given Keppra 1 gram here.  We will continue his home regimen of phenobarbital.  We will get an  electroencephalogram in the morning.  We will continue his home regimen  for pain control.  The rest of the plans are dependent on his progress  and the consultant's recommendations.      Lucita Ferrara, MD  Electronically Signed     RR/MEDQ  D:  02/14/2008  T:  02/14/2008  Job:  [id number]

## 2010-11-24 NOTE — Consult Note (Signed)
NAMECROSLEY, [name]              ACCOUNT NO.:  [id number]   MEDICAL RECORD NO.:  [id number]         PATIENT TYPE:  LINP   LOCATION:                               FACILITY:  [facility]   PHYSICIAN:  Michael L. Reynolds, M.D.DATE OF BIRTH:  1976/05/14   DATE OF CONSULTATION:  11/08/2007  DATE OF DISCHARGE:                                 CONSULTATION   REQUESTING PHYSICIAN:  [facility].   CHIEF COMPLAINT:  Seizure versus pseudoseizure.   HISTORY OF PRESENT ILLNESS:  This is the initial inpatient consultation  for evaluation of this 35 year old male with a past history remarkable  primarily for polysubstance abuse.  Throughout various parts of the  chart, spread across no fewer than seven medical record numbers, there  are reports of a past history of seizures with question of post  traumatic epilepsy.  Patient failed medication for seizures for years,  but did not take medication regularly for this.  It is unclear if he has  ever had any workup or seen a neurologist.  He has a longstanding  problem with polysubstance abuse, particularly alcohol, cocaine, and  hydrocodone.  He was admitted early this morning to [facility]  saying that he wished detox for his substance abuse problems.  In any  case, he was witnessed to have a couple of what were thought to be  generalized seizures, and he was brought to [facility]  [facility].  The attending physician at the emergency department at  [facility] states that he had seen a couple of the events, does seem to  have some suggestibility during the events.  The emergency room  attending physician states that the events are in fact psychogenic.   Presently the patient is lying in bed, still sedated after his seizures  were treated with lorazepam.  He is not all that cooperative with an  interview, but does interact on a regular basis.   PAST MEDICAL HISTORY:  Remarkable for question of seizures as above.   He  has had a few admissions to psychiatric hospitals.  Interestingly he also had pseudoseizures when he was admitted back in  November of 2004, or at least what was thought to be pseudoseizures.  He  also had admission to [facility] back in 2001.  There are no  other known chronic medical problems.   FAMILY HISTORY:  Unobtainable.   SOCIAL HISTORY:  He has significant problem with substance abuse as  noted above.    This is a healthy appearing man, supine in hospital bed, in no acute  distress.  HEAD:  Normocephalic.  Atraumatic.  Oropharynx benign.  NECK:  Supple without carotid or supraclavicular bruits.  HEART:  Regular rate and rhythm without murmurs.  NEUROLOGIC EXAMINATION:  Mental status:  He is sedated, but rouses  briefly to voice and tactile stimulus.  He feels that he is a truck.  Cannot tell the city, and states the month and year are January 2000.  He is able to follow one or two step commands and can name objects.  CRANIAL NERVES:  Pupils are equal  and reactive.  He does look to the  left and the right, and blinks at threat from both sides.  Face, tongue  and pallet move normally and symmetrically.  MOTOR:  Normal bulk and tone.  He is able to spontaneously move all  extremities.  Sensation intact to light touch in all extremities.  COORDINATION:  Finger to nose performed accurately.  GAIT:  Deferred.  REFLEXES:  2+ and symmetric.  Toes are downgoing bilaterally.   LABORATORY REVIEW:  CBC is unremarkable.  CMET is unremarkable.  Alcohol  level is unmeasurable.  He has had no neuro imaging.   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  Clinical  findings are suspicious for the latter, although withdrawal can  certainly precipitate the former.  He presently is sedated and nonfocal.   RECOMMENDATIONS:  Would treat his alcohol withdrawal symptoms with a  phenobarbital taper, rather than a Librium taper.  Would not use another  anticonvulsant  at this time.  Check an EEG in the morning.  Useful  markers to distinguish a seizure from a pseudoseizure include presence  of pupillary response and absence of hypoxia during a psychogenic  seizure.  I would also recommend keeping ammonia capsules at the bedside  and documenting reactivity during any further events that he might have.  If it is in fact true that he has pseudoseizures, he needs to go back to  [facility], as it will be inappropriate to keep in the medical  hospital for a psychogenic problem, and in fact, would be  counterproductive in that it might reinforce the presence of an organic  illness when there is none.  I have discussed all this with the  emergency room  physician, Dr. Effie Shy, as well as the admitting  hospitalist, Dr.McClung.   Thank you for the consultation.      Michael L. Thad Ranger, M.D.  Electronically Signed     MLR/MEDQ  D:  11/08/2007  T:  11/08/2007  Job:  [id number]

## 2010-11-24 NOTE — H&P (Signed)
NAMEATTICUS, WEDIN              ACCOUNT NO.:  [id number]   MEDICAL RECORD NO.:  [id number]         PATIENT TYPE:  LINP   LOCATION:                               FACILITY:  [facility]   PHYSICIAN:  Lonia Blood, M.D.DATE OF BIRTH:  07/12/1976   DATE OF ADMISSION:  11/08/2007  DATE OF DISCHARGE:                              HISTORY & PHYSICAL   PRIMARY CARE PHYSICIAN:  Unassigned.   CHIEF COMPLAINT:  Seizure at [facility].   HISTORY OF PRESENT ILLNESS:  [name] is a 35 year old  gentleman with a history of polysubstance abuse.  He was admitted at  [facility] earlier today for detox.  Apparently, during the  afternoon at [facility], he suffered the acute onset of what  was thought to be a possible tonic-clonic seizure.  He was given 2 mg of  IM of Ativan and taken to the [facility] emergency room.  We are asked  to see him to rule out a true seizure before the patient can be returned  to [facility] for further detox.  At the present time, the  patient is markedly sedated.  He has been loaded with fosphenytoin in  the emergency room.  No further seizure activity has apparently been  appreciated during his stay here.  The patient has already been  evaluated by neurology.  It is not clear if the patient truly suffered a  seizure.  There was apparently no loss of bowel or bladder continence.  The patient's previous seizures have apparently always been related to  withdrawal as well.  No other family members are present at the time of  this evaluation.   REVIEW OF SYSTEMS:  Comprehensive review of systems cannot be  accomplished secondary to the patient's sedation at the present time.   PAST MEDICAL HISTORY:  (Per old records)  1. Hypothyroidism.  2. Hypertension.  3. Alcohol abuse.  4. Tobacco abuse.  5. Cocaine abuse.  6. Narcotic abuse.  7. Chronic back pain.  8. Questionable seizure disorder - likely simply  secondary to multiple      episodes of withdrawal.0  9. Status post cholecystectomy.   OUTPATIENT MEDICATIONS:  Synthroid 75 mcg p.o. daily   ALLERGIES:  THE RECORDS INDICATE THE PATIENT GETS SHORT OF BREATH WHEN  HE TAKES GEODON, TORADOL, HALDOL OR THORAZINE.   FAMILY HISTORY:  Is noncontributory to this admission per review of old  records.   SOCIAL HISTORY:  Per old records - the patient has a 4th grade  education.  He is single.  He lives in [place].   DATA REVIEW:  CBC and CMET are all entirely unremarkable.  Alcohol level  is undetectable from today.   PHYSICAL EXAMINATION:  Temperature 97.5, blood pressure 108/63, heart  rate 76, respiratory 20, O2 saturation 97% on room air.  GENERAL:  Well-developed, well-nourished gentleman in no acute  respiratory distress.  NECK:  No JVD.  No lymphadenopathy.  No thyromegaly.  LUNGS:  Clear to auscultation bilaterally without wheezes or rhonchi.  CARDIOVASCULAR:  Regular rate and rhythm without murmur, gallop or  rub  with normal S1 and S2.  ABDOMEN:  Mildly overweight, soft, bowel sounds present.  No  hepatosplenomegaly.  No rebound.  No ascites.  EXTREMITIES:  No significant cyanosis, clubbing, or edema in bilateral  extremities.  NEUROLOGIC:  The patient is sedated and will not respond to questioning.  He does withdraw from painful stimuli in all four extremities.  There is  no posturing.  Cranial nerves II-XII appear to be intact on gross exam  without cooperation - please see full note by neurology.   IMPRESSION AND PLAN:  1. Seizure versus pseudoseizure - there is significant concern that      this represents a possible pseudoseizure versus a true withdrawal      seizure.  It is very unlikely that this patient has an actual      seizure disorder.  We will administer phenobarbital taper.  The      patient has already been loaded with fosphenytoin during his ER      stay.  We will monitor for further seizure activity.  We  will avoid      benzodiazepines or narcotics during this hospital stay.  At the      recommendation of neurology, other antiplatelet drugs are not felt      to be necessary at the present time.  2. Tobacco abuse - we will administer a nicotine patch on as needed      basis.  3. History of narcotic abuse - we will administer empiric clonidine.      I will also use a phenobarbital taper for the dual purpose of      alcohol withdrawal treatment as well as treatment for possible      withdrawal seizure.  4. Hypothyroidism - we will check a TSH.  Will continue the patient's      reported home dose of Synthroid.      Lonia Blood, M.D.  Electronically Signed     JTM/MEDQ  D:  11/08/2007  T:  11/08/2007  Job:  [id number]

## 2010-11-24 NOTE — Procedures (Signed)
EEG:  [id number].   CLINICAL HISTORY:  The patient is a 35 year old with possible alcohol  withdrawal seizures.  Study is being done to look for the presence of  seizures (780.39).   PROCEDURE:  The tracing is carried out on a 32-channel digital Cadwell  recorder reformatted into 16 channel montages with one devoted to EKG.  The patient was awake and drowsy during the recording.  The  international 10/20 system lead placement was used.   Medications include phenobarbital, Synthroid, Xanax.   DESCRIPTION OF FINDINGS:  Dominant frequency is a 9 Hz, 10 microvolt  activity.  Broadly distributed 12 Hz, 10-20 microvolt beta range  activity was seen.  There was no focal slowing.  The patient toward the  end of the record drifts into natural sleep with vertex sharp waves and  symmetric and synchronous sleep spindles.  The patient before that  becomes drowsy with mixed frequency theta range activity of about 15  microvolts.   There was no interictal epileptiform activity in the form of spikes or  sharp waves.  Hyperventilation caused little change in background.  Photic stimulation was not carried out.  EKG showed regular sinus rhythm  with ventricular response of 60 beats per minute.   IMPRESSION:  Normal record with the patient awake and asleep with a low-  voltage background with excessive beta range activity associated with  the use of Xanax and also at times seen in alcohol withdrawal.      Deanna Artis. Sharene Skeans, M.D.  Electronically Signed     YNW:GNFA  D:  11/09/2007 21:19:06  T:  11/10/2007 07:17:35  Job #:  [id number]   cc:   [facility]

## 2010-11-27 NOTE — Discharge Summary (Signed)
NAMEETHELBERT, THAIN NO.:  [id number]   MEDICAL RECORD NO.:  [id number]                   PATIENT TYPE:  INP   LOCATION:  8119                                 FACILITY:  [facility]   PHYSICIAN:  Hollice Espy, M.D.            DATE OF BIRTH:  15-May-1976   DATE OF ADMISSION:  05/21/2003  DATE OF DISCHARGE:                                 DISCHARGE SUMMARY   HISTORY OF PRESENT ILLNESS:  Mr. Dahlen is a 35 year old man with a history  of depression, crack cocaine use, multiple narcotic addictions and alcohol  abuse who was being treated at [facility] for suicidal ideation when  he had several seizure like episodes and was sent to [facility] for  evaluation. It was unknown if these seizures were actually true seizures or  if he was having pseudoseizures. The patient stated that he last had a drink  on Monday evening. Upon arrival at the ED, the patient remained relatively  stable and started on an Ativan protocol. He was sent up to the floor where  he remained relatively stable; however, he continued pain seeking behavior  requesting Demerol for his shoulder and back. Requesting both increases in  his IV Demerol. He had no episode of further seizures and remained alert and  oriented. He had multiple episodes of being verbally abusive and threatening  to some of the nurses and floor staff. I expressed my concern about this to  the patient and informed him that these episodes were not to occur. In  addition, I changed him from IV Demerol to p.o. Demerol and the patient  stated that he only took IV Demerol. The patient remained relatively stable.  I decreased the dosage of the Ativan protocol and patient remained stable.  Over the course of the early morning hours of May 22, 2003, the patient  had a questionable seizure; however, he responded to his name. He said he  fell on the bathroom floor which he had a sitter who did witness this. He  hit  his head upon the floor. Upon arrival to the room, the patient started  having a questionable seizure, he responded to his name during activity and  he was able to open his eyes. He was able to repeat some of his conversation  and I felt that this was not a true seizure, this was a false pseudoseizure.  When I informed the patient that he was otherwise fine and we would not be  giving him any further pain medications other than what was scheduled and we  would not be increasing the dose nor returning to IV and that he would be  returned to [facility] today May 23, 2003, he reported he was  fine with it and then he asked to go out for a smoke. He was allowed only on  the condition that he go with his sitter. Upon going out,  he had more than  one episode of falling and having seizure like activities. During this time,  his vitals were all stable and reportedly one episode would last and then  the patient would open his eyes look around and then go back and have  another one. Again in my mind, these are not true seizures. The patient is  medically stable for discharge back to [facility] and the plan is  that these are indeed pseudoseizures. He has secondary gains from this to  gain narcotic use.   PLAN:  For patient to return to [facility].   DISPOSITION:  Essentially unchanged from previous.   DISCHARGE MEDICATIONS:  In regards to his discharge medications, reportedly  he is on Xanax 1 mg q.i.d. and Demerol 50-100 mg q.i.d. IV. I will let the  physicians at [facility] determine whether or not the patient should  continue on these medications and if so the dosing and administration forms  will be determined by them.   ACTIVITY:  As tolerated and allowed by [facility].                                               Hollice Espy, M.D.   SKK/MEDQ  D:  05/23/2003  T:  05/23/2003  Job:  [id number]

## 2010-11-27 NOTE — H&P (Signed)
[facility]  Patient:    [name], [name]                       MRN: [id number] Adm. Date:  [id number] Disc. Date: [id number] Attending:  Otilio Saber Dictator:   Valinda Hoar, N.P.                   Psychiatric Admission Assessment  IDENTIFYING INFORMATION:  [name] is a 35 year old single white male admitted on an involuntary commitment January 27, 2000, for depression with a suicidal plan.  He was petitioned by the ACT team due to suicidal thoughts with a plan to cut both wrists.  HISTORY OF PRESENT ILLNESS:  Apparently  he was picked up by an ambulance last night due to "a seizure" and was taken to [facility] ED.  He was seen at [facility] ED and they felt like he was having pseudoseizures and possibly drug seeking to get medication.  The patient reports he has been depressed for about a month.  He states his appetite is good.  His sleep is poor but it does vary.  He says one day this past week he drank 23 beers in one day.  However, he denies any alcohol dependence.  He states he is not currently suicidal or homicidal.  Again the patient at [facility] felt the patient was medication seeking and had a pseudoseizure.  Apparently a cousin died last week at the age of 65 in a motor vehicle accident.  He reports he was close to the cousin.  He reports suicidal ideation for a week and was going to cut his wrists. Apparently he has overdosed on crack in the past, January 22, 2000.  However, he now denies this.  This patient is very unreliable.  He refuses to give a lot of information.  He tells me he has had a seizure disorder since he was a child and he is on phenobarbital, dose unknown.  However, he has not taken the phenobarbital for a week.  I did ask who was prescribing the phenobarbital. He was very reluctant to tell me at first.  He said a Dr. Excell Seltzer in [place], [place]. When I told him we would need to contact her, he said she was not the one prescribing the  phenobarb.  He is also on Xanax but he does not know who is prescribing it or is refusing to give it to [place].  This is a very mixed picture. He states he has no family.  He did list one person as an emergency contact, however, we have not been able to contact him.  It is very difficult to ascertain what is going on with this patient.  PAST PSYCHIATRIC HISTORY:  According to the patient, he has no previous treatment. Again, this is unreliable information.  PAST MEDICAL HISTORY:  patient states he sees a Dr. Excell Seltzer, a male in [place], [place].  However, he is receiving phenobarbital and Xanax from Xanax, however, he states he does not know the name or is refusing to give [place] the name.  He then told me when he was discharged or signed out AMA from [facility] last week, they gave him the phenobarbital.  Medical problems include, according to the patient, a seizure disorder since childhood.  He states his last seizure was in the ambulance last night going to [facility].  The physician at [facility] felt he was having pseudoseizures and was possibly medication  seeking.  He has most recently been hospitalized at [facility], apparently in the intensive care unit for a pneumonia.  Apparently, according to him, he signed out AMA from the hospital prior to being treated appropriately for his pneumonia.  CURRENT MEDICATIONS:  Phenobarbital, dose unknown at bedtime.  He did receive phenobarbital last night 30 mg and 30 mg this morning.  He reports he has not taken the phenobarbital for one month prior to last night at the [facility] ED.  He is on Xanax 1 mg t.i.d. for panic attacks for about two months; however, he will not tell or cannot tell us who his medical doctor is who prescribed the medications.  ALLERGIES:  No known drug allergies.  SOCIAL HISTORY:  The patient states he lives alone in a trailer in [place], [place].  He is an only child He is single.  He says  he has not seen his parents for years.  He says they live somewhere in [place], he does not know where.  He reports he is on disability for mental retardation, however, at this point in time we are unable to verify this.  FAMILY HISTORY:  None, according to patient.  ALCOHOL/DRUG HISTORY:  Patient states the only time he drank alcohol was yesterday and he drank 23 beers.  According to the history he gave last night he used cocaine at the age of 48.  He is currently denying any substance abuse. The patient does smoke, even with his medical problems.  POSITIVE PHYSICAL FINDINGS:  Please see physical exam done at [facility] ED on January 27, 2000.  The EKG done at [facility] showed normal sinus rhythm and no acute changes.  He did come in complaining of chest pain.  Again, he was at [facility] being treated for pneumonia and left AMA and apparently did not finish his treatment.  Currently, he is refusing all labs here at the hospital.  He will not have his blood drawn.  He is refusing a chest x-ray and basically just says he wants to leave.  We finally talked him into having the labs since he was complaining of a headache, chest pain, with a productive cough.  He also had some decreased breath sounds on the left side with bilateral wheezing along with a headache.  We are going to do stat labs, a stat chest x-ray while the patient is agreeing to do this.  CURRENT MENTAL STATUS EXAM:  Again, the patient refuses to cooperate. He is a large Caucasian male dressed casually.  He is uncooperative. Speech:  He mutters, it is very low, hard to understand him. Mood:  Angry at times. Affect:  Can be labile but other times he appears sullen.  He is currently denying suicidal ideation.  Thought process:  Unable to assess at this time. He does not appear to be having any hallucinations, however, he will not verify this.  Cognitive:  He is alert.  We were unable to evaluate his orientation  or his cognitive functioning due to the patients refusal to cooperate.  Some question of borderline intellectual functioning.  CURRENT DIAGNOSES:  Axis I:   Depressive disorder, not otherwise specified, rule out substance           abuse, rule out borderline intellectual functioning. Axis II:  Deferred.  Rule out mental retardation. 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  Has a productive cough,  headache, decreased breath sounds on the left side plus bilateral           wheezing. Axis IV:  Severe, problems with primary support group and related to social           environment and medical problems. Axis V:  TREATMENT PLAN AND RECOMMENDATIONS:  He is is on involuntary commitment to [facility] because of suicidal ideation with a plan x 1 week. Our goal will be to check him every 15 minutes, maintain safety, do a chest x-ray stat and also he has finally agreed to let us do the lab work to see what his lab work is showing.  We are going to try to obtain a history from [facility] regarding his history, his lab work, if they gave him the phenobarbital prescription, what the diagnosis was and why he was in intensive care, to get more history what was going on with this patient.  The patient, again is refusing lab work and refusing to let us get a phenobarbital level.  He is also refusing to give [place] history, the name of his physician and he also has no relatives we can contact.  He did list one emergency contact in the chart and we have been unable to reach that person. Tentative length of stay and discharge plan 3-5 days. DD:  01/27/00 TD:  01/28/00 Job: [id number]

## 2010-11-27 NOTE — H&P (Signed)
[name], [name] NO.:  [id number]   MEDICAL RECORD NO.:  [id number]                   PATIENT TYPE:  IPS   LOCATION:  0406                                 FACILITY:  [facility]   PHYSICIAN:  Jeanice Lim, M.D.              DATE OF BIRTH:  [birth year]   DATE OF ADMISSION:  05/23/2003  DATE OF DISCHARGE:  05/23/2003                         PSYCHIATRIC ADMISSION ASSESSMENT   IDENTIFYING INFORMATION:  This is a 35 year old single white male.  This was  a voluntary admission.   HISTORY OF PRESENT ILLNESS:  This patient, initially, had been accepted for  admission on May 21, 2003 to [facility] and, upon arrival,  began having tonic-clonic motor movements, believed to be seizing and was  transferred to [facility].  After he was medically cleared there,  and they determined that these were possibly pseudoseizures, he was  transferred back to [facility].  Upon arrival yesterday, May 23, 2003, the patient was agitated with pressured speech, insisting on having  parenteral Demerol because he states that he had caught his arm in the  elevator while being transported to [facility].  X-rays had showed no  fracture.  The patient had claimed that he was suicidal because of his drug  abuse and that was the reason for his admission.  He had also admitted to  abusing cocaine and taking Xanax 1 mg four times a day with no clear source  for the prescription.  He had received multiple doses of Demerol on the  medicine service and he was insistent on receiving again.  After some time,  he agreed to start on a detox protocol and we started him on a phenobarbital  protocol with a 130 mg loading dose, which he did accept as an IM injection  and, because of his agitation and his belligerent behavior, we gave him 100  mg of Seroquel, which he did accept.  The patient did fall to the floor at  one point and had what appeared to be  a seizure with tonic-clonic movements  lasting approximately five minutes but, during that time, he had no loss of  bowel and bladder function and did respond to his name.  He continued to  request pain medicine and, as witnessed by more than of the technicians on  the unit, patient threw himself intentionally against the wall to dislocate  his right shoulder.  It did appear to be dislocated and patient stated, now  can I go to the emergency room and get pain medicines.  The patient was  subsequently transferred to the emergency room for reduction of what  appeared to be a dislocated shoulder.  He did, during the process of  admission, continue to say that he was suicidal.  He denied any auditory or  visual hallucinations and was oriented x 3.   PAST PSYCHIATRIC HISTORY:  We were not able to  find a record of prior  admissions to [facility].  However, he did seem to  be known to the staff.  He has a history of prior admissions on the medicine  service including two admissions in 2001 for clearance of non-cardiac chest  pain during which time the patient had seizures which were believed to be  pseudoseizures and it was felt during those admissions that the patient was  getting secondary gain from his complaints in order to obtain narcotic pain  medication.   SOCIAL HISTORY:  This is a single white male who resides in [place], [place].  He has declined to give additional information.   FAMILY HISTORY:  Not known.   ALCOHOL/DRUG HISTORY:  The patient has a history of frequent emergency room  visits and requests for IV narcotics and benzodiazepines.  Current usage is  unclear.   MEDICAL PROBLEMS:  The patient has a history of prior admissions complaining  of chest pain with negative workups.  He denied prior surgeries and denied  any history of heart attack.  He had denied current medical problems other  than pain in his right arm as noted above and the  incident with the shoulder  as noted above.   MEDICATIONS:  On the medical unit, the patient had received Ativan  repeatedly, oral and IV, and he had received 100 mg doses of IM Demerol on a  regular q.4h. basis, which had been transitioned to oral Demerol.   PHYSICAL EXAMINATION:  Please see the physical examination that was  originally done in the emergency room at the time of admission to the  medicine service.   MENTAL STATUS EXAM:  This is a well-nourished, well-developed male who is in  no acute distress.  He is calm and coherent as he requests medications.  His  affect is neutral.  Speech pressured.  He has been hollering for medications  and insistent.  He has been somewhat belligerent with the nurses.  Mood is  irritable and belligerent.  Thought process:  The patient continues to  report suicidal ideation.  No clear evidence of homicidal ideation.  He is  oriented x 3.  His thought process is clear and goal-oriented.  No evidence  of internal stimulation or auditory or visual hallucinations.  Cognitively,  he is intact and oriented x 3.   DIAGNOSES:   AXIS I:  1. Rule out major depression, recurrent.  2. Polysubstance abuse.   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   AXIS III:  1. Cellulitis of the right hand.  2. Contusion, right arm.  3. Rule out dislocated right shoulder.   AXIS IV:  Deferred.   AXIS V:  Current 22; past year unknown.   PLAN:  Transfer the patient to the emergency room for additional evaluation  of his shoulder and, at that point, to determine the best setting for  managing his care.  He had been uncooperative here.  We will evaluate the  plan after he is evaluated in the emergency room.   ESTIMATED LENGTH OF STAY:  Five days.     Margaret A. Stephannie Peters                   Jeanice Lim, M.D.    MAS/MEDQ  D:  05/24/2003  T:  05/24/2003  Job:  [id number]
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
## 2010-11-27 NOTE — Discharge Summary (Signed)
[name], [name] NO.:  [id number]   MEDICAL RECORD NO.:  [id number]          PATIENT TYPE:  INP   LOCATION:  3021                         FACILITY:  [facility]   PHYSICIAN:  Lonia Blood, M.D.       DATE OF BIRTH:  1976-06-11   DATE OF ADMISSION:  02/13/2008  DATE OF DISCHARGE:  02/15/2008                               DISCHARGE SUMMARY   PRIMARY CARE PHYSICIAN:  The patient claims that his primary care  physician is in [place], [place], and his name is Dr. Earlene Plater.  I could  not find a doctor with that name in that location.   DISCHARGE DIAGNOSES:  1. Pseudoseizures.  2. History of seizures according to the patient.  3. Probable substance abuse.  4. Acute agitation upon admission, query withdrawal from      benzodiazepines and opiates - resolved.  5. Drug-seeking behavior.  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   DISCHARGE MEDICATIONS:  1. Phenobarbital 30 mg twice a day.  2. Ativan 1 mg 3 times a day.  3. Vicodin 10/325 four times a day.   CONDITION ON DISCHARGE:  [name] was discharged in a stable  condition.  He had to be escorted by the [facility] Department  off the hospital premises.   PROCEDURE:  During this admission, this patient did refuse EEG and CT  scan of the head.   CONSULTATION:  During this admission, the patient was seen by Dr. Pearlean Brownie  from Neurology and Dr. Antonietta Breach from Psychiatry.  History and  Physical dictated by Dr. Flonnie Overman on February 13, 2008.   HOSPITAL COURSE:  Acute agitation.  [name] was admitted via the  emergency room where he was having seizures.  Upon reviewing all the  records and the physical examination of the patient, it did become  apparent to myself, Dr. Pearlean Brownie, Dr. Jeanie Sewer, and all the nurses  involved in the patient's care that he was not having actually seizures  but more likely pseudoseizures.  The episodes were ones of limb jerking.  They were quickly followed by a yell to the  nurse give me my Ativan.  The patient initially was given Keppra, but then upon the recommendation  of Dr. Pearlean Brownie he was taken off of that medication and continued on  phenobarbital, the medication he was claiming he was supposed to be on.  We attempted multiple times to find the patient's medical records, but  he was never able to provide [place] with a clear address, phone number of  his pharmacy or primary care physician's office.  Dr. Earlene Plater in [place]  information that he was giving [place] could never be confirmed.  Throughout  this hospitalization, [name] has been continuously disruptive to  the staff and the other patients in the hospital.  He has requested  multiple times narcotics, promethazine, and benzodiazepines by the name  to be given to him intravenously.  He claimed multiple falls during this  hospitalization, but every time he would be examined no signs of trauma  or injury could be found.  He was offered x-raying of the  parts of the body he was claiming that he  had injured, but he had systematically refused that.  [name] was  asked about family members or anybody else able to provide history and  confirm his story, but he was never able to provide an adult name and  phone number.      Lonia Blood, M.D.  Electronically Signed     SL/MEDQ  D:  02/16/2008  T:  02/16/2008  Job:  [id number]
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
## 2011-03-17 ENCOUNTER — Encounter: Payer: Self-pay | Admitting: *Deleted

## 2011-03-17 ENCOUNTER — Emergency Department (HOSPITAL_COMMUNITY): Payer: Medicaid Other

## 2011-03-17 ENCOUNTER — Emergency Department (HOSPITAL_COMMUNITY)
Admission: EM | Admit: 2011-03-17 | Discharge: 2011-03-17 | Disposition: A | Discharge status: Home / Self Care | Payer: Medicaid Other | Attending: Emergency Medicine | Admitting: Emergency Medicine

## 2011-03-17 DIAGNOSIS — IMO0002 Reserved for concepts with insufficient information to code with codable children: Secondary | ICD-10-CM | POA: Insufficient documentation

## 2011-03-17 DIAGNOSIS — T148XXA Other injury of unspecified body region, initial encounter: Secondary | ICD-10-CM

## 2011-03-17 DIAGNOSIS — F411 Generalized anxiety disorder: Secondary | ICD-10-CM | POA: Insufficient documentation

## 2011-03-17 DIAGNOSIS — M62838 Other muscle spasm: Secondary | ICD-10-CM | POA: Insufficient documentation

## 2011-03-17 DIAGNOSIS — S40019A Contusion of unspecified shoulder, initial encounter: Secondary | ICD-10-CM | POA: Insufficient documentation

## 2011-03-17 DIAGNOSIS — F17200 Nicotine dependence, unspecified, uncomplicated: Secondary | ICD-10-CM | POA: Insufficient documentation

## 2011-03-17 HISTORY — DX: Anxiety disorder, unspecified: F41.9

## 2011-03-17 MED ORDER — DIAZEPAM 5 MG PO TABS: 10.0000 mg | ORAL_TABLET | Freq: Four times a day (QID) | ORAL | Status: DC | PRN

## 2011-03-17 MED ORDER — HYDROCODONE-ACETAMINOPHEN 5-325 MG PO TABS: 1.0000 | ORAL_TABLET | Freq: Four times a day (QID) | ORAL | Status: AC | PRN

## 2011-03-17 MED ORDER — DIAZEPAM 5 MG/ML IJ SOLN
10.0000 mg | Freq: Once | INTRAMUSCULAR | Status: AC
  Administered 2011-03-17: 10 mg via INTRAMUSCULAR
  Filled 2011-03-17: qty 2

## 2011-03-17 MED ORDER — ONDANSETRON HCL 4 MG/2ML IJ SOLN: 4.0000 mg | Freq: Once | INTRAMUSCULAR | Status: DC

## 2011-03-17 MED ORDER — HYDROMORPHONE HCL 2 MG/ML IJ SOLN
INTRAMUSCULAR | Status: AC
  Filled 2011-03-17: qty 1

## 2011-03-17 MED ORDER — HYDROMORPHONE HCL 2 MG/ML IJ SOLN
2.0000 mg | Freq: Once | INTRAMUSCULAR | Status: AC
  Administered 2011-03-17: 2 mg via INTRAMUSCULAR

## 2011-03-17 MED ORDER — ONDANSETRON 4 MG PO TBDP
4.0000 mg | ORAL_TABLET | Freq: Once | ORAL | Status: AC
  Administered 2011-03-17: 4 mg via ORAL

## 2011-03-17 MED ORDER — ONDANSETRON 4 MG PO TBDP
ORAL_TABLET | ORAL | Status: AC
  Administered 2011-03-17: 4 mg via ORAL
  Filled 2011-03-17: qty 1

## 2011-03-17 MED ORDER — DIPHENHYDRAMINE HCL 50 MG/ML IJ SOLN
50.0000 mg | Freq: Once | INTRAMUSCULAR | Status: AC
  Administered 2011-03-17: 50 mg via INTRAMUSCULAR
  Filled 2011-03-17: qty 1

## 2011-03-17 MED ORDER — ONDANSETRON HCL 4 MG/2ML IJ SOLN
INTRAMUSCULAR | Status: AC
  Filled 2011-03-17: qty 2

## 2011-03-17 MED ORDER — HYDROMORPHONE HCL 2 MG/ML IJ SOLN
INTRAMUSCULAR | Status: AC
  Administered 2011-03-17: 2 mg via INTRAMUSCULAR
  Filled 2011-03-17: qty 1

## 2011-03-17 NOTE — ED Notes (Signed)
Pt c/o left sided shoulder pain/ pt states he injured his shoulder while working on a car and car fell on his shoulder
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
## 2011-03-17 NOTE — ED Notes (Signed)
Patient complaining of itching. RN Nehemiah Settle aware.

## 2011-03-18 NOTE — ED Provider Notes (Signed)
Medical screening examination/treatment/procedure(s) were performed by non-physician practitioner and as supervising physician I was immediately available for consultation/collaboration.  Hurman Horn, MD 03/18/11 [phone]

## 2011-03-22 ENCOUNTER — Emergency Department (HOSPITAL_COMMUNITY): Payer: Medicaid Other

## 2011-03-22 ENCOUNTER — Encounter (HOSPITAL_COMMUNITY): Payer: Self-pay | Admitting: *Deleted

## 2011-03-22 ENCOUNTER — Emergency Department (HOSPITAL_COMMUNITY)
Admission: EM | Admit: 2011-03-22 | Discharge: 2011-03-22 | Disposition: A | Discharge status: Home / Self Care | Payer: Medicaid Other | Attending: Emergency Medicine | Admitting: Emergency Medicine

## 2011-03-22 DIAGNOSIS — E039 Hypothyroidism, unspecified: Secondary | ICD-10-CM | POA: Insufficient documentation

## 2011-03-22 DIAGNOSIS — M549 Dorsalgia, unspecified: Secondary | ICD-10-CM | POA: Insufficient documentation

## 2011-03-22 DIAGNOSIS — G8929 Other chronic pain: Secondary | ICD-10-CM

## 2011-03-22 DIAGNOSIS — M25519 Pain in unspecified shoulder: Secondary | ICD-10-CM | POA: Insufficient documentation

## 2011-03-22 DIAGNOSIS — F17200 Nicotine dependence, unspecified, uncomplicated: Secondary | ICD-10-CM | POA: Insufficient documentation

## 2011-03-22 DIAGNOSIS — F411 Generalized anxiety disorder: Secondary | ICD-10-CM | POA: Insufficient documentation

## 2011-03-22 HISTORY — DX: Other psychoactive substance abuse, uncomplicated: F19.10

## 2011-03-22 HISTORY — DX: Dorsalgia, unspecified: M54.9

## 2011-03-22 HISTORY — DX: Hypothyroidism, unspecified: E03.9

## 2011-03-22 HISTORY — DX: Conversion disorder with seizures or convulsions: F44.5

## 2011-03-22 HISTORY — DX: Pain in unspecified shoulder: M25.519

## 2011-03-22 HISTORY — DX: Other chronic pain: G89.29

## 2011-03-22 MED ORDER — FENTANYL CITRATE 0.05 MG/ML IJ SOLN
100.0000 ug | Freq: Once | INTRAMUSCULAR | Status: AC
  Administered 2011-03-22: 100 ug via INTRAMUSCULAR
  Filled 2011-03-22: qty 2

## 2011-03-22 NOTE — ED Notes (Signed)
States was working under car x 5 days ago when jack fell and back tire of car fell on right shoulder.  States shoulder popped back in and has popped out again x 12 hrs ago.  C/o increased pain to right shoulder.

## 2011-03-22 NOTE — ED Notes (Signed)
Pt left er stating no needs 
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
## 2011-04-06 LAB — CBC
HCT: 37.1 — ABNORMAL LOW
HCT: 41.4
Hemoglobin: 13.1
Hemoglobin: 13.7
MCHC: 33.1
MCHC: 35.2
MCV: 89.7
MCV: 90.1
Platelets: 226
Platelets: 256
RBC: 4.12 — ABNORMAL LOW
RBC: 4.62
RDW: 13.3
RDW: 13.5
WBC: 7.4
WBC: 7.5

## 2011-04-06 LAB — RAPID URINE DRUG SCREEN, HOSP PERFORMED
Amphetamines: NEGATIVE — AB
Benzodiazepines: POSITIVE — AB
Opiates: POSITIVE — AB
Tetrahydrocannabinol: NEGATIVE — AB

## 2011-04-06 LAB — COMPREHENSIVE METABOLIC PANEL
ALT: 19
ALT: 24
AST: 37
Albumin: 3.7
Alkaline Phosphatase: 52
Calcium: 8.9
GFR calc Af Amer: >60
GFR calc Af Amer: >60
Glucose, Bld: 128 — ABNORMAL HIGH
Glucose, Bld: 99
Sodium: 139
Total Protein: 5.6 — ABNORMAL LOW
Total Protein: 6.2

## 2011-04-06 LAB — DIFFERENTIAL
Eosinophils Absolute: 0.2
Lymphocytes Relative: 22
Lymphs Abs: 1.7
Monocytes Relative: 10
Neutrophils Relative %: 64

## 2011-04-07 LAB — POCT CARDIAC MARKERS
CKMB, poc: 1
Myoglobin, poc: 33.2
Operator id: 179121
Troponin i, poc: <0.05

## 2011-04-07 LAB — DIFFERENTIAL
Basophils Absolute: 0
Lymphocytes Relative: 25
Lymphs Abs: 2.4
Neutro Abs: 6.1
Neutrophils Relative %: 62

## 2011-04-07 LAB — CBC
HCT: 38.7 — ABNORMAL LOW
Hemoglobin: 14.2
MCV: 88.3
Platelets: 250
RBC: 4.39
RDW: 13.1
WBC: 9.7

## 2011-04-07 LAB — RAPID URINE DRUG SCREEN, HOSP PERFORMED
Amphetamines: NOT DETECTED
Benzodiazepines: POSITIVE — AB
Opiates: POSITIVE — AB
Tetrahydrocannabinol: NOT DETECTED

## 2011-04-07 LAB — BASIC METABOLIC PANEL
BUN: 16
Calcium: 9.4
Creatinine, Ser: 0.97
GFR calc non Af Amer: >60
Potassium: 3.9

## 2011-04-09 LAB — COMPREHENSIVE METABOLIC PANEL
AST: 26
Albumin: 3.9
BUN: 17
Calcium: 9.1
Creatinine, Ser: 1.05
GFR calc Af Amer: >60
GFR calc non Af Amer: >60

## 2011-04-09 LAB — PHENOBARBITAL LEVEL: Phenobarbital: <5 — ABNORMAL LOW

## 2011-04-09 LAB — BASIC METABOLIC PANEL
BUN: 17
Chloride: 106
GFR calc non Af Amer: >60
Potassium: 4.5
Sodium: 135

## 2011-04-09 LAB — CBC
HCT: 41.6
HCT: 41.8
Hemoglobin: 14.5
Hemoglobin: 15.1
MCHC: 34.7
MCHC: 36.3 — ABNORMAL HIGH
MCV: 90.2
MCV: 90.3
Platelets: 301
RBC: 4.62
RBC: 4.64
RDW: 12.7
RDW: 13.1
WBC: 11.2 — ABNORMAL HIGH
WBC: 12.3 — ABNORMAL HIGH

## 2011-04-09 LAB — DIFFERENTIAL
Basophils Absolute: 0
Basophils Relative: 0
Eosinophils Absolute: 0.2
Eosinophils Relative: 2
Eosinophils Relative: 2
Lymphocytes Relative: 20
Lymphocytes Relative: 21
Monocytes Absolute: 0.9
Monocytes Relative: 8
Neutro Abs: 7.7
Neutro Abs: 8.9 — ABNORMAL HIGH

## 2011-04-09 LAB — SALICYLATE LEVEL: Salicylate Lvl: <4

## 2011-04-13 LAB — BASIC METABOLIC PANEL
CO2: 24
Chloride: 107
Creatinine, Ser: 0.96
GFR calc Af Amer: >60
Potassium: 3.6
Sodium: 139

## 2011-04-29 LAB — BASIC METABOLIC PANEL
BUN: 12
CO2: 24
Calcium: 9
Chloride: 113 — ABNORMAL HIGH
Creatinine, Ser: 1.17
GFR calc Af Amer: >60
GFR calc non Af Amer: >60
Glucose, Bld: 115 — ABNORMAL HIGH
Potassium: 3.6
Sodium: 141

## 2011-04-29 LAB — DIFFERENTIAL
Basophils Absolute: 0
Basophils Relative: 0
Eosinophils Absolute: 0.1
Eosinophils Relative: 1
Lymphocytes Relative: 17
Lymphs Abs: 2
Monocytes Absolute: 1.5 — ABNORMAL HIGH
Monocytes Relative: 13 — ABNORMAL HIGH
Neutro Abs: 8.1 — ABNORMAL HIGH
Neutrophils Relative %: 70

## 2011-04-29 LAB — CBC
HCT: 39
Hemoglobin: 13.8
MCHC: 35.4
MCV: 88.5
Platelets: 275
RBC: 4.4
RDW: 12.7
WBC: 11.7 — ABNORMAL HIGH

## 2011-04-29 LAB — PHENYTOIN LEVEL, TOTAL
Phenytoin Lvl: 10.1
Phenytoin Lvl: 8.1 — ABNORMAL LOW
Phenytoin Lvl: 9.7 — ABNORMAL LOW

## 2011-04-29 LAB — RAPID URINE DRUG SCREEN, HOSP PERFORMED
Amphetamines: NOT DETECTED
Barbiturates: NOT DETECTED
Benzodiazepines: POSITIVE — AB
Cocaine: POSITIVE — AB
Opiates: POSITIVE — AB
Tetrahydrocannabinol: NOT DETECTED

## 2011-04-29 LAB — ETHANOL: Alcohol, Ethyl (B): <5

## 2011-07-13 DIAGNOSIS — Z9289 Personal history of other medical treatment: Secondary | ICD-10-CM

## 2011-07-13 HISTORY — DX: Personal history of other medical treatment: Z92.89

## 2011-08-14 ENCOUNTER — Encounter (HOSPITAL_COMMUNITY): Payer: Self-pay | Admitting: *Deleted

## 2011-08-14 ENCOUNTER — Emergency Department (HOSPITAL_COMMUNITY)
Admission: EM | Admit: 2011-08-14 | Discharge: 2011-08-15 | Disposition: A | Discharge status: Home / Self Care | Payer: Medicaid Other | Attending: Emergency Medicine | Admitting: Emergency Medicine

## 2011-08-14 DIAGNOSIS — G8929 Other chronic pain: Secondary | ICD-10-CM | POA: Insufficient documentation

## 2011-08-14 DIAGNOSIS — M549 Dorsalgia, unspecified: Secondary | ICD-10-CM | POA: Insufficient documentation

## 2011-08-14 DIAGNOSIS — E039 Hypothyroidism, unspecified: Secondary | ICD-10-CM | POA: Insufficient documentation

## 2011-08-14 DIAGNOSIS — R062 Wheezing: Secondary | ICD-10-CM

## 2011-08-14 DIAGNOSIS — R0602 Shortness of breath: Secondary | ICD-10-CM | POA: Insufficient documentation

## 2011-08-14 DIAGNOSIS — J40 Bronchitis, not specified as acute or chronic: Secondary | ICD-10-CM

## 2011-08-14 DIAGNOSIS — M25519 Pain in unspecified shoulder: Secondary | ICD-10-CM | POA: Insufficient documentation

## 2011-08-14 MED ORDER — ALBUTEROL SULFATE (5 MG/ML) 0.5% IN NEBU
5.0000 mg | INHALATION_SOLUTION | Freq: Once | RESPIRATORY_TRACT | Status: AC
  Administered 2011-08-14: 5 mg via RESPIRATORY_TRACT
  Filled 2011-08-14: qty 1

## 2011-08-14 MED ORDER — PREDNISONE 10 MG PO TABS: 20.0000 mg | ORAL_TABLET | Freq: Every day | ORAL | Status: AC

## 2011-08-14 MED ORDER — HYDROCODONE-ACETAMINOPHEN 5-325 MG PO TABS
1.0000 | ORAL_TABLET | Freq: Once | ORAL | Status: AC
  Administered 2011-08-14: 1 via ORAL
  Filled 2011-08-14: qty 1

## 2011-08-14 MED ORDER — PREDNISONE 20 MG PO TABS
60.0000 mg | ORAL_TABLET | Freq: Once | ORAL | Status: AC
  Administered 2011-08-14: 60 mg via ORAL
  Filled 2011-08-14 (×2): qty 3

## 2011-08-14 MED ORDER — IPRATROPIUM BROMIDE 0.02 % IN SOLN
0.5000 mg | Freq: Once | RESPIRATORY_TRACT | Status: AC
  Administered 2011-08-14: 0.5 mg via RESPIRATORY_TRACT
  Filled 2011-08-14: qty 2.5

## 2011-08-14 MED ORDER — HYDROCODONE-ACETAMINOPHEN 5-325 MG PO TABS: 1.0000 | ORAL_TABLET | ORAL | Status: AC | PRN

## 2011-08-14 NOTE — ED Notes (Signed)
Pt given a coke 

## 2011-08-14 NOTE — ED Notes (Signed)
States with sob and cough for 2 weeks, was seen at [facility] and dx with bronchitis and started on antibiotics about 3-5 days ago
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
## 2011-08-14 NOTE — ED Notes (Signed)
Pt lying in bed watching tv. Pt keeps asking for something to drink. Pt advised that he needs to wait until seen by edp

## 2011-08-14 NOTE — ED Provider Notes (Signed)
This chart was scribed for [employer]. Colon Branch, MD by Williemae Natter. The patient was seen in room APA03/APA03 at 10:00 PM.  CSN: [id number]  Arrival date & time 08/14/11  2032   First MD Initiated Contact with Patient 08/14/11 2132      Chief Complaint  Patient presents with  . Shortness of Breath  . Cough    (Consider location/radiation/quality/duration/timing/severity/associated sxs/prior treatment) HPI [name] is a 36 y.o. male who presents to the Emergency Department complaining of a moderate to severe acute onset cough for 4 days. Pt has associated wheezing and shortness of breath. Pt is a regular smoker. Pt was seen at [facility] and diagnosed with bronchitis 4 days ago.  Past Medical History  Diagnosis Date  . Anxiety disorder   . Chronic pain   . Pseudoseizure   . Polysubstance abuse   . Hypothyroidism   . Chronic back pain   . Chronic shoulder pain     History reviewed. No pertinent past surgical history.  History reviewed. No pertinent family history.  History  Substance Use Topics  . Smoking status: Current Everyday Smoker -- 1.0 packs/day    Types: Cigarettes  . Smokeless tobacco: Not on file  . Alcohol Use: No      Review of Systems 10 Systems reviewed and are negative for acute change except as noted in the HPI.  Allergies  Chlorpromazine hcl; Haloperidol lactate; and Ketorolac tromethamine  Home Medications   Current Outpatient Rx  Name Route Sig Dispense Refill  . ALPRAZOLAM 1 MG PO TABS Oral Take 1 mg by mouth 4 (four) times daily.      [name] Kitchen LEVOTHYROXINE SODIUM 100 MCG PO TABS Oral Take 100 mcg by mouth daily.        BP 126/82  Pulse 104  Temp(Src) 98 F (36.7 C) (Oral)  Resp 20  Ht 5\' 9"  (1.753 m)  Wt 240 lb (108.863 kg)  BMI 35.44 kg/m2  SpO2 94%  Physical Exam  Nursing note and vitals reviewed. Constitutional: He is oriented to person, place, and time. He appears well-developed and well-nourished.  HENT:  Head: Normocephalic  and atraumatic.  Eyes: Conjunctivae are normal. Pupils are equal, round, and reactive to light.  Neck: Normal range of motion. Neck supple.  Cardiovascular: Regular rhythm and normal heart sounds.  Tachycardia present.   Pulmonary/Chest: He has wheezes (diffuse expiratory wheezing throughout).       Coarse cough  Abdominal: Soft. There is no tenderness.  Neurological: He is alert and oriented to person, place, and time.  Skin: Skin is warm and dry.  Psychiatric: He has a normal mood and affect. His behavior is normal.    ED Course  Procedures (including critical care time) DIAGNOSTIC STUDIES: Oxygen Saturation is 94% on room air, adequate by my interpretation.    COORDINATION OF CARE:  Medications  albuterol (PROVENTIL) (5 MG/ML) 0.5% nebulizer solution 5 mg (5 mg Nebulization Given 08/14/11 2251)  ipratropium (ATROVENT) nebulizer solution 0.5 mg (0.5 mg Nebulization Given 08/14/11 2251)  predniSONE (DELTASONE) tablet 60 mg (60 mg Oral Given 08/14/11 2227)  HYDROcodone-acetaminophen (NORCO) 5-325 MG per tablet 1 tablet (1 tablet Oral Given 08/14/11 2227)   2220 Reviewed records from [facility]. He was seen 08/14/11 and diagnosed with bronchitis. He was given Rx for Avalox and also given Rx for his medicines he told them he had run out of; Albuterol, Synthroid 100 mcg daily 90 day supply, Alprazolam  1 mg #10, Depakote 500 mg BID 90  days.      MDM  Patient with recent diagnosis of bronchitis here with wheezing, cough and chest wall pain when coughing. Given nebulizer treatment with resolution of wheezing, analgeisc with improvement of discomfort. Initiated steroid therapy. Pt feels improved after observation and/or treatment in ED.Pt stable in ED with no significant deterioration in condition.The patient appears reasonably screened and/or stabilized for discharge and I doubt any other medical condition or other [facility] requiring further screening, evaluation, or treatment in the ED at this  time prior to discharge.  I personally performed the services described in this documentation, which was scribed in my presence. The recorded information has been reviewed and considered.   MDM Reviewed: nursing note, vitals and previous chart Reviewed previous: labs and x-ray        Nicoletta Dress. Colon Branch, MD 08/14/11 1610

## 2012-02-09 DIAGNOSIS — R079 Chest pain, unspecified: Secondary | ICD-10-CM

## 2012-02-18 DIAGNOSIS — R079 Chest pain, unspecified: Secondary | ICD-10-CM

## 2012-06-22 ENCOUNTER — Emergency Department (HOSPITAL_COMMUNITY): Payer: Medicaid Other

## 2012-06-22 ENCOUNTER — Emergency Department (HOSPITAL_COMMUNITY)
Admission: EM | Admit: 2012-06-22 | Discharge: 2012-06-23 | Disposition: A | Discharge status: Other Acute Inpatient Hospital | Payer: Medicaid Other | Attending: Emergency Medicine | Admitting: Emergency Medicine

## 2012-06-22 DIAGNOSIS — G40901 Epilepsy, unspecified, not intractable, with status epilepticus: Secondary | ICD-10-CM

## 2012-06-22 DIAGNOSIS — G40401 Other generalized epilepsy and epileptic syndromes, not intractable, with status epilepticus: Secondary | ICD-10-CM | POA: Insufficient documentation

## 2012-06-22 LAB — URINALYSIS, ROUTINE W REFLEX MICROSCOPIC
Bilirubin Urine: NEGATIVE
Glucose, UA: NEGATIVE mg/dL
Nitrite: NEGATIVE
Specific Gravity, Urine: >1.03 — ABNORMAL HIGH (ref 1.005–1.030)
pH: 5.5 (ref 5.0–8.0)

## 2012-06-22 LAB — RAPID URINE DRUG SCREEN, HOSP PERFORMED
Benzodiazepines: POSITIVE — AB
Cocaine: POSITIVE — AB
Opiates: NOT DETECTED

## 2012-06-22 MED ORDER — PROPOFOL 10 MG/ML IV EMUL
INTRAVENOUS | Status: AC
  Filled 2012-06-22: qty 100

## 2012-06-22 MED ORDER — LORAZEPAM 2 MG/ML IJ SOLN
INTRAMUSCULAR | Status: AC
  Administered 2012-06-23
  Filled 2012-06-22: qty 1

## 2012-06-22 MED ORDER — LORAZEPAM 2 MG/ML IJ SOLN
INTRAMUSCULAR | Status: AC
  Filled 2012-06-22: qty 2

## 2012-06-22 MED ORDER — LORAZEPAM 2 MG/ML IJ SOLN
INTRAMUSCULAR | Status: AC
  Administered 2012-06-22
  Filled 2012-06-22: qty 1

## 2012-06-22 MED ORDER — LIDOCAINE HCL (CARDIAC) 20 MG/ML IV SOLN
INTRAVENOUS | Status: AC
  Filled 2012-06-22: qty 5

## 2012-06-22 MED ORDER — ETOMIDATE 2 MG/ML IV SOLN
INTRAVENOUS | Status: AC
  Filled 2012-06-22: qty 20

## 2012-06-22 MED ORDER — PROPOFOL 10 MG/ML IV EMUL: 5.0000 ug/kg/min | INTRAVENOUS | Status: DC

## 2012-06-22 MED ORDER — LEVETIRACETAM 500 MG/5ML IV SOLN
INTRAVENOUS | Status: AC
  Filled 2012-06-22: qty 10

## 2012-06-22 MED ORDER — SODIUM CHLORIDE 0.9 % IV SOLN
1000.0000 mg | Freq: Once | INTRAVENOUS | Status: DC
  Filled 2012-06-22: qty 10

## 2012-06-22 MED ORDER — PROPOFOL 10 MG/ML IV EMUL
5.0000 ug/kg/min | INTRAVENOUS | Status: DC
  Administered 2012-06-22: 10 ug/kg/min via INTRAVENOUS

## 2012-06-22 MED ORDER — SODIUM CHLORIDE 0.9 % IV BOLUS (SEPSIS): 1000.0000 mL | Freq: Once | INTRAVENOUS | Status: DC

## 2012-06-22 MED ORDER — ROCURONIUM BROMIDE 50 MG/5ML IV SOLN
INTRAVENOUS | Status: AC
  Filled 2012-06-22: qty 2

## 2012-06-22 MED ORDER — SODIUM CHLORIDE 0.9 % IV SOLN
20.0000 mg/kg | Freq: Once | INTRAVENOUS | Status: DC
  Filled 2012-06-22: qty 45.4

## 2012-06-22 MED ORDER — SUCCINYLCHOLINE CHLORIDE 20 MG/ML IJ SOLN
INTRAMUSCULAR | Status: AC
  Filled 2012-06-22: qty 1

## 2012-06-22 NOTE — ED Notes (Signed)
Propofol drip started at 5mcg/kg/min.

## 2012-06-22 NOTE — ED Notes (Signed)
Per registration staff, patient came to desk and stated he needed to use the bathroom.  Per visitors in waiting room, patient went down on all fours and began having jerking motions.

## 2012-06-22 NOTE — ED Notes (Signed)
Patient states he hasn't taken his seizure medication x 4 days.  Patient states he also takes Hydrocodone and Xanax, which he also has not had in 4 days.

## 2012-06-22 NOTE — ED Notes (Signed)
Succinylcholine 120mg  IV given by Hardie Pulley, RN.

## 2012-06-22 NOTE — ED Notes (Signed)
Patient given Ativan 2mg  IM in right thigh.

## 2012-06-22 NOTE — ED Notes (Signed)
Patient awake and talking after foley inserted.  Patient requesting to have foley removed; states he wants to refuse treatment.  Foley removed by Silva Bandy, RN.

## 2012-06-22 NOTE — ED Notes (Signed)
Called to waiting room for someone actively seizing.  Patient lying on floor in front of bathroom having seizure like activity.  Head and airway being supported by Loney Laurence, PA assisted by Karen Chafe, RN.  Patient opens eyes and when asked questions, begins having seizure activity.  Patient contracts arms to inner core during seizure.  Patient placed on stretcher and taken to room 1.

## 2012-06-22 NOTE — ED Notes (Signed)
Etomidate 20mg  IV given by Hardie Pulley, RN.  Patient actively seizing.  Dr. Dierdre Highman at bedside to intubate.

## 2012-06-22 NOTE — ED Notes (Signed)
Patient intubated with 8.0 ETT by Dr. Dierdre Highman x 1 attempt.  Positive color change and clear breath sounds throughout.  ETT taped at 24 at lip.

## 2012-06-22 NOTE — ED Notes (Signed)
Patient given Ativan 2mg  IV for continued seizure activity.

## 2012-06-22 NOTE — ED Provider Notes (Signed)
History  This chart was scribed for Sunnie Nielsen, MD by Manuela Schwartz, ED scribe. This patient was seen in room APA01/APA01 and the patient's care was started at 2248.   CSN: [id number]  Arrival date & time 06/22/12  2248   First MD Initiated Contact with Patient 06/22/12 2301      Chief Complaint  Patient presents with  . Seizures   The history is provided by the patient. The history is limited by the condition of the patient. No language interpreter was used.  Level 5 caveat applies for unresponsive, active seizures [name] is an adult male unknown age who presents to the Emergency Department after pt went down in waiting room and had witnessed seizures, pt was brought back to a room and has had 10-12 seizures over the past 25 minutes since arrival to ED. Pt has hx of seizures and unable to give hx b/c is actively seizing in room. Reportedly to staff, pt has been out of benzos, lortab, and depakote for the past 4 days. He is unable to relay any history to me.   Blood sugar 92 on arrival to treatment room   No past medical history on file.  No past surgical history on file.  No family history on file.  History  Substance Use Topics  . Smoking status: Not on file  . Smokeless tobacco: Not on file  . Alcohol Use: Not on file      Review of Systems  Unable to perform ROS All other systems reviewed and are negative.  level 5 caveat applies as above active seizures  Allergies  Review of patient's allergies indicates not on file.  Home Medications  No current outpatient prescriptions on file.  Ht 70" (177.8 cm)  Wt 250 lb (113.399 kg)  BMI 35.87 kg/m2  Physical Exam  Nursing note and vitals reviewed. Constitutional:       Active seizures  HENT:       Normocephalic, atraumatic  Eyes: Pupils are equal, round, and reactive to light.       PERRL between seizures  Neck:       No stridor, trachea midline  Cardiovascular:       Tachycardic, regular rhythm   Pulmonary/Chest: Breath sounds normal.       Clear and equal breath sounds  Abdominal: Soft. Bowel sounds are normal.       Soft,   Musculoskeletal:       Seizure activity, no deformity, no trauma  Neurological:       Actively recurrent seizure activity, aphasic,   Skin: Skin is warm and dry. No rash noted.    ED Course  INTUBATION Date/Time: 06/22/2012 11:13 PM Performed by: Sunnie Nielsen Authorized by: Sunnie Nielsen Consent: The procedure was performed in an emergent situation. Patient identity confirmed: arm band Time out: Immediately prior to procedure a "time out" was called to verify the correct patient, procedure, equipment, support staff and site/side marked as required. Indications: respiratory failure and airway protection Intubation method: video-assisted Patient status: paralyzed (RSI) Preoxygenation: BVM Sedatives: etomidate Paralytic: succinylcholine Laryngoscope size: Mac 4 Tube size: 7.5 mm Tube type: cuffed Number of attempts: 1 Cricoid pressure: yes Cords visualized: yes Post-procedure assessment: chest rise and CO2 detector Breath sounds: equal Cuff inflated: yes ETT to lip: 24 cm Tube secured with: ETT holder Chest x-ray interpreted by me. Patient tolerance: Patient tolerated the procedure well with no immediate complications.  CENTRAL LINE Date/Time: 06/23/2012 1:34 AM Performed by: Sunnie Nielsen Authorized by:  Kaytelynn Scripter Consent: The procedure was performed in an emergent situation. Required items: required blood products, implants, devices, and special equipment available Patient identity confirmed: arm band Time out: Immediately prior to procedure a "time out" was called to verify the correct patient, procedure, equipment, support staff and site/side marked as required. Indications: vascular access Anesthesia: local infiltration Local anesthetic: lidocaine 1% without epinephrine Anesthetic total: 2 ml Preparation: skin prepped with 2%  chlorhexidine Skin prep agent dried: skin prep agent completely dried prior to procedure Sterile barriers: all five maximum sterile barriers used - cap, mask, sterile gown, sterile gloves, and large sterile sheet Hand hygiene: hand hygiene performed prior to central venous catheter insertion Location details: right internal jugular Patient position: Trendelenburg Catheter type: triple lumen Pre-procedure: landmarks identified Ultrasound guidance: yes Number of attempts: 1 Successful placement: yes Post-procedure: line sutured and dressing applied Assessment: blood return through all parts, free fluid flow and placement verified by x-ray Patient tolerance: Patient tolerated the procedure well with no immediate complications.    COORDINATION OF CARE: At 1115 PM  Given a total of 6mg  ativan and still seizing, set up for intubation.   2330: d/w PCCM Dr Deterding accepts PT for transfer to Dr. Kendrick Fries ICU, recs NEU consult.  2335 d/w DR Petra Kuba NEU recs dilantin load now.  He called me back and feels that if this does not work will need to go to [facility] as [facility] does not have burst suppression capability at this time. Dilantin ordered, still seizing. Propofol increased.    Results for orders placed during the hospital encounter of 06/22/12  CBC      Component Value Range   WBC 10.3  4.0 - 10.5 K/uL   RBC 4.35  4.22 - 5.81 MIL/uL   Hemoglobin 13.6  13.0 - 17.0 g/dL   HCT 16.1  09.6 - 04.5 %   MCV 91.3  78.0 - 100.0 fL   MCH 31.3  26.0 - 34.0 pg   MCHC 34.3  30.0 - 36.0 g/dL   RDW 40.9  81.1 - 91.4 %   Platelets 328  150 - 400 K/uL  COMPREHENSIVE METABOLIC PANEL      Component Value Range   Sodium 143  135 - 145 mEq/L   Potassium 3.9  3.5 - 5.1 mEq/L   Chloride 108  96 - 112 mEq/L   CO2 25  19 - 32 mEq/L   Glucose, Bld 114 (*) 70 - 99 mg/dL   BUN 10  6 - 23 mg/dL   Creatinine, Ser 7.82  0.50 - 1.35 mg/dL   Calcium 9.4  8.4 - 95.6 mg/dL   Total Protein 7.2  6.0 - 8.3 g/dL    Albumin 3.8  3.5 - 5.2 g/dL   AST 23  0 - 37 U/L   ALT 31  0 - 53 U/L   Alkaline Phosphatase 76  39 - 117 U/L   Total Bilirubin 0.2 (*) 0.3 - 1.2 mg/dL   GFR calc non Af Amer NOT CALCULATED  >90 mL/min   GFR calc Af Amer NOT CALCULATED  >90 mL/min  LACTIC ACID, PLASMA      Component Value Range   Lactic Acid, Venous 2.7 (*) 0.5 - 2.2 mmol/L  URINE RAPID DRUG SCREEN (HOSP PERFORMED)      Component Value Range   Opiates NONE DETECTED  NONE DETECTED   Cocaine POSITIVE (*) NONE DETECTED   Benzodiazepines POSITIVE (*) NONE DETECTED   Amphetamines NONE DETECTED  NONE DETECTED  Tetrahydrocannabinol NONE DETECTED  NONE DETECTED   Barbiturates NONE DETECTED  NONE DETECTED  URINALYSIS, ROUTINE W REFLEX MICROSCOPIC      Component Value Range   Color, Urine YELLOW  YELLOW   APPearance CLEAR  CLEAR   Specific Gravity, Urine >1.030 (*) 1.005 - 1.030   pH 5.5  5.0 - 8.0   Glucose, UA NEGATIVE  NEGATIVE mg/dL   Hgb urine dipstick NEGATIVE  NEGATIVE   Bilirubin Urine NEGATIVE  NEGATIVE   Ketones, ur TRACE (*) NEGATIVE mg/dL   Protein, ur NEGATIVE  NEGATIVE mg/dL   Urobilinogen, UA 0.2  0.0 - 1.0 mg/dL   Nitrite NEGATIVE  NEGATIVE   Leukocytes, UA NEGATIVE  NEGATIVE  VALPROIC ACID LEVEL      Component Value Range   Valproic Acid Lvl <10.0 (*) 50.0 - 100.0 ug/mL  BLOOD GAS, ARTERIAL      Component Value Range   FIO2 100.00     Delivery systems VENTILATOR     Mode PRESSURE REGULATED VOLUME CONTROL     VT 600     Rate 12     Peep/cpap 5.0     pH, Arterial 7.380  7.350 - 7.450   pCO2 arterial 42.9  35.0 - 45.0 mmHg   pO2, Arterial 304.0 (*) 80.0 - 100.0 mmHg   Bicarbonate 24.8 (*) 20.0 - 24.0 mEq/L   TCO2 22.4  0 - 100 mmol/L   Acid-Base Excess 0.3  0.0 - 2.0 mmol/L   O2 Saturation 99.5     Patient temperature 37.0     Collection site BRACHIAL ARTERY     Drawn by COLLECTED BY RT     Sample type ARTERIAL     Allens test (pass/fail) NOT INDICATED (*) PASS   Ct Head Wo  Contrast  06/23/2012  *RADIOLOGY REPORT*  Clinical Data: Seizures and unresponsive.  CT HEAD WITHOUT CONTRAST  Technique:  Contiguous axial images were obtained from the base of the skull through the vertex without contrast.  Comparison: None.  Findings: Mild cerebral atrophy.  No ventricular dilatation.  No mass effect or midline shift.  No abnormal extra-axial fluid collections.  Gray-white matter junctions are distinct.  Basal cisterns are not effaced.  No evidence of acute intracranial hemorrhage.  Calcification of the pineal gland.  Pineal gland measures upper limits of normal at about 11 mm.  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  No depressed skull fractures.  Mucosal thickening in the maxillary antra, frontal, and sphenoid sinuses with opacification of ethmoid air cells.  Mastoid air cells are not opacified.  IMPRESSION: No acute intracranial abnormalities.  Inflammatory change in the paranasal sinuses.   Original Report Authenticated By: Burman Nieves, M.D.    Dg Chest Portable 1 View  06/23/2012  *RADIOLOGY REPORT*  Clinical Data: Right central line placement  PORTABLE CHEST - 1 VIEW  Comparison: [birth year]1929 at 2337 hours  Findings: Interval placement of right internal jugular venous catheter with tip over the mid SVC region.  No pneumothorax. Endotracheal tube remains with tip at 3.1 cm above the carina. Shallow inspiration with developing infiltration or atelectasis in the lung bases.  Borderline heart size.  IMPRESSION: Appliances appear in satisfactory location.  Shallow inspiration with developing atelectasis in the lung bases.   Original Report Authenticated By: Burman Nieves, M.D.    Dg Chest Portable 1 View  06/22/2012  *RADIOLOGY REPORT*  Clinical Data: Seizures.  Endotracheal tube placement.  PORTABLE CHEST - 1 VIEW  Comparison: None.  Findings: Endotracheal tube placed  with tip 3.9 cm above the carina.  Shallow inspiration. The heart size and pulmonary vascularity are normal.  The lungs appear clear and expanded without focal air space disease or consolidation. No blunting of the costophrenic angles.  No pneumothorax.  Mediastinal contours appear intact.  IMPRESSION: Endotracheal tube tip is about 3.9 cm above the carina.  No evidence of active pulmonary disease.   Original Report Authenticated By: Burman Nieves, M.D.       Date: 06/23/2012  Rate: 98  Rhythm: normal sinus rhythm  QRS Axis: normal  Intervals: normal  ST/T Wave abnormalities: nonspecific ST changes  Conduction Disutrbances:none  Narrative Interpretation:   Old EKG Reviewed: none available    CRITICAL CARE Performed by: Raffaela Ladley   Total critical care time: 90  Critical care time was exclusive of separately billable procedures and treating other patients.  Critical care was necessary to treat or prevent imminent or life-threatening deterioration.  Critical care was time spent personally by me on the following activities: development of treatment plan with patient and/or surrogate as well as nursing, discussions with consultants, evaluation of patient's response to treatment, examination of patient, obtaining history from staff who interacted with PT prior to my evaluation, ordering and performing treatments and interventions, ordering and review of laboratory studies, ordering and review of radiographic studies, pulse oximetry and re-evaluation of patient's condition. Titrated propofol drip to suppress seizure activity- requiring and maintaining BP WNL with IVFs.   12:31 AM d/w PALS line Dr Mayford Knife (NEU) and Dr Melvyn Neth (ICU) who recommend NEU consult for NEU admit.  PT now on Propofol at 80 without any clinical seizure activity. NEU feels this dose of propofol requires continuous EEG monitoring which is unavailable at [facility] at this time.   1:02 AM d/w DR Braxton Feathers at [facility] who accepts PT to Neuro ICU and continue dilantin for now. Cont propofol.  1:36 AM CVC placed and CT brain  obtained. Transport team bedside. CT and CXR reviewed as above. No further Sz activity. MDM  Continuous seizure activity unchanged with ativan 6mg , required intubation and placed on propofol drip requiring high doses to suppress clinical seizure activity. Dilantin drip initiated. Multiple consultants involved. Neuro ICU admit/ transfer to [facility].   Ativan. Propofol. RSI. Dilantin IV load  Labs. CXR. CT brain all obtained/ reviewed.   Intubation. Central line. Critical care  ICU admit    I personally performed the services described in this documentation, which was scribed in my presence. The recorded information has been reviewed and is accurate.          Sunnie Nielsen, MD 06/23/12 [phone]

## 2012-06-23 ENCOUNTER — Emergency Department (HOSPITAL_COMMUNITY): Payer: Medicaid Other

## 2012-06-23 LAB — COMPREHENSIVE METABOLIC PANEL
BUN: 10 mg/dL (ref 6–23)
CO2: 25 mEq/L (ref 19–32)
Calcium: 9.4 mg/dL (ref 8.4–10.5)
Creatinine, Ser: 0.97 mg/dL (ref 0.50–1.35)
Glucose, Bld: 114 mg/dL — ABNORMAL HIGH (ref 70–99)
Sodium: 143 mEq/L (ref 135–145)
Total Protein: 7.2 g/dL (ref 6.0–8.3)

## 2012-06-23 LAB — BLOOD GAS, ARTERIAL
Acid-Base Excess: 0.3 mmol/L (ref 0.0–2.0)
Bicarbonate: 24.8 mEq/L — ABNORMAL HIGH (ref 20.0–24.0)
FIO2: 100 %
O2 Saturation: 99.5 %
PEEP: 5 cmH2O
Patient temperature: 37
RATE: 12 resp/min
pO2, Arterial: 304 mmHg — ABNORMAL HIGH (ref 80.0–100.0)

## 2012-06-23 LAB — CBC
HCT: 39.7 % (ref 39.0–52.0)
Hemoglobin: 13.6 g/dL (ref 13.0–17.0)
MCH: 31.3 pg (ref 26.0–34.0)
MCHC: 34.3 g/dL (ref 30.0–36.0)
MCV: 91.3 fL (ref 78.0–100.0)

## 2012-06-23 LAB — LACTIC ACID, PLASMA: Lactic Acid, Venous: 2.7 mmol/L — ABNORMAL HIGH (ref 0.5–2.2)

## 2012-06-23 MED ORDER — LORAZEPAM 2 MG/ML IJ SOLN
INTRAMUSCULAR | Status: AC
  Filled 2012-06-23: qty 1

## 2012-06-23 MED ORDER — PROPOFOL 10 MG/ML IV EMUL
INTRAVENOUS | Status: AC
  Filled 2012-06-23: qty 100

## 2012-06-23 NOTE — ED Notes (Signed)
Transported via [facility] to [facility]

## 2012-06-23 NOTE — ED Notes (Signed)
Diprivan titrated to sedation, currently resting quietly.

## 2012-06-23 NOTE — ED Notes (Signed)
Patient transported to CT on telemetry with nurse.

## 2012-06-23 NOTE — Progress Notes (Deleted)
06/22/2012 time is 2350

## 2012-06-23 NOTE — ED Notes (Signed)
Returned from CT.

## 2012-06-23 NOTE — Progress Notes (Signed)
Pt entered as [place] doe originally as 1 day old,  Nothing on vent could be charted till age updated to adult.  Pt was intubated by ER MD for seizures,  Number 8 et tube at 24 at lip, vent settings finally set at [facility] 12, 600, 100, 5 peep. Around 2350. Pt was taken off vent because of seizure activity unable to ventilate PT. Later place back on around 0010 06/23/2012 same settings. Abg obtained , et tube pulled back to 22 , pt later transported to [place].

## 2012-11-04 DIAGNOSIS — R079 Chest pain, unspecified: Secondary | ICD-10-CM

## 2013-08-30 ENCOUNTER — Emergency Department (HOSPITAL_COMMUNITY)
Admission: EM | Admit: 2013-08-30 | Discharge: 2013-08-30 | Disposition: A | Discharge status: Home / Self Care | Payer: Medicaid Other | Attending: Emergency Medicine | Admitting: Emergency Medicine

## 2013-08-30 ENCOUNTER — Encounter (HOSPITAL_COMMUNITY): Payer: Self-pay | Admitting: Emergency Medicine

## 2013-08-30 DIAGNOSIS — Z79899 Other long term (current) drug therapy: Secondary | ICD-10-CM | POA: Insufficient documentation

## 2013-08-30 DIAGNOSIS — G40909 Epilepsy, unspecified, not intractable, without status epilepticus: Secondary | ICD-10-CM | POA: Insufficient documentation

## 2013-08-30 DIAGNOSIS — F411 Generalized anxiety disorder: Secondary | ICD-10-CM | POA: Insufficient documentation

## 2013-08-30 DIAGNOSIS — Z760 Encounter for issue of repeat prescription: Secondary | ICD-10-CM | POA: Insufficient documentation

## 2013-08-30 DIAGNOSIS — G8929 Other chronic pain: Secondary | ICD-10-CM | POA: Insufficient documentation

## 2013-08-30 DIAGNOSIS — F17200 Nicotine dependence, unspecified, uncomplicated: Secondary | ICD-10-CM | POA: Insufficient documentation

## 2013-08-30 DIAGNOSIS — E039 Hypothyroidism, unspecified: Secondary | ICD-10-CM | POA: Insufficient documentation

## 2013-08-30 MED ORDER — ALPRAZOLAM 1 MG PO TABS: 1.0000 mg | ORAL_TABLET | Freq: Every evening | ORAL | Status: DC | PRN

## 2013-08-30 MED ORDER — DIVALPROEX SODIUM 500 MG PO DR TAB: 500.0000 mg | DELAYED_RELEASE_TABLET | Freq: Two times a day (BID) | ORAL | Status: DC

## 2013-08-30 MED ORDER — HYDROCODONE-ACETAMINOPHEN 5-325 MG PO TABS: 2.0000 | ORAL_TABLET | Freq: Four times a day (QID) | ORAL | Status: DC | PRN

## 2013-08-30 MED ORDER — LEVOTHYROXINE SODIUM 100 MCG PO TABS: 100.0000 ug | ORAL_TABLET | Freq: Every day | ORAL | Status: DC

## 2013-08-30 NOTE — ED Notes (Signed)
Pt states he was told to come to the ED to get his xanax, hydrocodone, synthroid and Depakote refilled

## 2013-08-30 NOTE — ED Provider Notes (Signed)
CSN: [id number]     Arrival date & time 08/30/13  1354 History   First MD Initiated Contact with Patient 08/30/13 1424     Chief Complaint  Patient presents with  . Medication Refill     (Consider location/radiation/quality/duration/timing/severity/associated sxs/prior Treatment) The history is provided by the patient. No language interpreter was used.  Pt is a 38 year old male who has recently moved here from [place] and has applied for a new medicaid card. He reports that he tried to get his regular medicines filled by his doctor in [place] but he wouldn't see him. He also reports that he tried to go to the local health department and they told him to come to the ER and get his meds refilled until he gets his card. He is here requesting refills of his current medications.   Past Medical History  Diagnosis Date  . Anxiety disorder   . Chronic pain   . Pseudoseizure   . Polysubstance abuse   . Hypothyroidism   . Chronic back pain   . Chronic shoulder pain    No past surgical history on file. No family history on file. History  Substance Use Topics  . Smoking status: Current Every Day Smoker -- 1.00 packs/day    Types: Cigarettes  . Smokeless tobacco: Not on file  . Alcohol Use: No    Review of Systems  Constitutional: Negative for fever and chills.  Respiratory: Negative for cough and shortness of breath.   Cardiovascular: Negative for chest pain.  Gastrointestinal: Negative for abdominal pain.      Allergies  Chlorpromazine hcl; Haloperidol lactate; and Ketorolac tromethamine  Home Medications   Current Outpatient Rx  Name  Route  Sig  Dispense  Refill  . ALPRAZolam (XANAX) 1 MG tablet   Oral   Take 1 mg by mouth 4 (four) times daily.           . divalproex (DEPAKOTE) 500 MG DR tablet   Oral   Take 500 mg by mouth 2 (two) times daily.         [name] Kitchen HYDROcodone-acetaminophen (NORCO) 10-325 MG per tablet   Oral   Take 1 tablet by mouth every 6 (six) hours as needed.         [name] Kitchen ibuprofen (ADVIL,MOTRIN) 200 MG tablet   Oral   Take 600 mg by mouth every 6 (six) hours as needed for moderate pain.         [name] Kitchen levothyroxine (SYNTHROID, LEVOTHROID) 100 MCG tablet   Oral   Take 100 mcg by mouth daily.            BP 122/76  Pulse 94  Temp(Src) 98 F (36.7 C) (Oral)  Resp 18  Ht 5\' 9"  (1.753 m)  Wt 219 lb (99.338 kg)  BMI 32.33 kg/m2  SpO2 99% Physical Exam  Nursing note and vitals reviewed. Constitutional: He is oriented to person, place, and time. He appears well-developed and well-nourished. No distress.  HENT:  Head: Normocephalic and atraumatic.  Eyes: Conjunctivae and EOM are normal.  Neck: Normal range of motion. Neck supple. No JVD present. No tracheal deviation present. No thyromegaly present.  Cardiovascular: Normal rate, regular rhythm and normal heart sounds.   Pulmonary/Chest: Effort normal and breath sounds normal. No respiratory distress. He has no wheezes.  Musculoskeletal: Normal range of motion.  Lymphadenopathy:    He has no cervical adenopathy.  Neurological: He is alert and oriented to person, place, and time.  Skin: Skin is  warm and dry.  Psychiatric: He has a normal mood and affect. His behavior is normal. Judgment and thought content normal.    ED Course  Procedures (including critical care time) Labs Review Labs Reviewed - No data to display Imaging Review No results found.  EKG Interpretation   None       MDM   Final diagnoses:  Medication refill    No complaints, feeling ok today. Reports that he needs refills on his current medicines. Refill x 1 given on his current meds and advised that he will need to establish PCP or find a doctor that will fill his meds from now on. Advised that ER is not the place for med refills and he will need a doctor to manage his care. Pt agrees.      Irish EldersKelly Mela Perham, NP 09/07/13 [phone]

## 2013-08-30 NOTE — Discharge Instructions (Signed)
Medication Refill, Emergency Department We have refilled your medication today as a courtesy to you. It is best for your medical care, however, to take care of getting refills done through your primary caregiver's office. They have your records and can do a better job of follow-up than we can in the emergency department. On maintenance medications, we often only prescribe enough medications to get you by until you are able to see your regular caregiver. This is a more expensive way to refill medications. In the future, please plan for refills so that you will not have to use the emergency department for this. Thank you for your help. Your help allows us to better take care of the daily emergencies that enter our department. Document Released: 10/15/2003 Document Revised: 09/20/2011 Document Reviewed: 06/28/2005 [facility] Patient Information 2014 [employer], [place].  Keep appt with the health department on 09/26/2013

## 2013-09-08 NOTE — ED Provider Notes (Signed)
Medical screening examination/treatment/procedure(s) were performed by non-physician practitioner and as supervising physician I was immediately available for consultation/collaboration.   EKG Interpretation None        Benny LennertJoseph L Mckenleigh Tarlton, MD 09/08/13 [phone]

## 2013-09-29 ENCOUNTER — Encounter (HOSPITAL_COMMUNITY): Payer: Self-pay | Admitting: Emergency Medicine

## 2013-09-29 ENCOUNTER — Emergency Department (HOSPITAL_COMMUNITY)
Admission: EM | Admit: 2013-09-29 | Discharge: 2013-09-29 | Disposition: A | Discharge status: Home / Self Care | Payer: Medicaid Other | Attending: Emergency Medicine | Admitting: Emergency Medicine

## 2013-09-29 DIAGNOSIS — Z760 Encounter for issue of repeat prescription: Secondary | ICD-10-CM

## 2013-09-29 DIAGNOSIS — F17200 Nicotine dependence, unspecified, uncomplicated: Secondary | ICD-10-CM | POA: Insufficient documentation

## 2013-09-29 DIAGNOSIS — G40909 Epilepsy, unspecified, not intractable, without status epilepticus: Secondary | ICD-10-CM | POA: Insufficient documentation

## 2013-09-29 DIAGNOSIS — F411 Generalized anxiety disorder: Secondary | ICD-10-CM | POA: Insufficient documentation

## 2013-09-29 DIAGNOSIS — G8929 Other chronic pain: Secondary | ICD-10-CM | POA: Insufficient documentation

## 2013-09-29 DIAGNOSIS — E039 Hypothyroidism, unspecified: Secondary | ICD-10-CM | POA: Insufficient documentation

## 2013-09-29 DIAGNOSIS — Z79899 Other long term (current) drug therapy: Secondary | ICD-10-CM | POA: Insufficient documentation

## 2013-09-29 MED ORDER — TRAMADOL HCL 50 MG PO TABS: 50.0000 mg | ORAL_TABLET | Freq: Four times a day (QID) | ORAL | Status: DC | PRN

## 2013-09-29 MED ORDER — LEVOTHYROXINE SODIUM 100 MCG PO TABS: 100.0000 ug | ORAL_TABLET | Freq: Every day | ORAL | Status: DC

## 2013-09-29 MED ORDER — ALPRAZOLAM 1 MG PO TABS: 1.0000 mg | ORAL_TABLET | Freq: Every evening | ORAL | Status: DC | PRN

## 2013-09-29 MED ORDER — DIVALPROEX SODIUM 500 MG PO DR TAB: 500.0000 mg | DELAYED_RELEASE_TABLET | Freq: Every morning | ORAL | Status: DC

## 2013-09-29 NOTE — ED Notes (Addendum)
Pt presents to er for medication refills, states that he moved from [place] to [place] on feb 14th, was advised that his pcp could no longer refill his prescriptions due to being in another state and being on medicaid, was seen in er on 08/30/2013 for refills, states that [facility] has been changed from march 18 to April, pt needs refills for synthroid 100mcg once a day, xanax 1 mg TID, Hydrocone/tylenol 10-325mg  TID. Depakote 500mg  once a day,

## 2013-09-29 NOTE — Discharge Instructions (Signed)
Medication Refill, Emergency Department  We have refilled your medication today as a courtesy to you. It is best for your medical care, however, to take care of getting refills done through your primary caregiver's office. They have your records and can do a better job of follow-up than we can in the emergency department.  On maintenance medications, we often only prescribe enough medications to get you by until you are able to see your regular caregiver. This is a more expensive way to refill medications.  In the future, please plan for refills so that you will not have to use the emergency department for this.  Thank you for your help. Your help allows us to better take care of the daily emergencies that enter our department.  Document Released: 10/15/2003 Document Revised: 09/20/2011 Document Reviewed: 06/28/2005  [employer]® Patient Information ©2014 [employer].

## 2013-09-29 NOTE — ED Provider Notes (Signed)
CSN: [id number]     Arrival date & time 09/29/13  1659 History   First MD Initiated Contact with Patient 09/29/13 1723     Chief Complaint  Patient presents with  . Medication Refill     (Consider location/radiation/quality/duration/timing/severity/associated sxs/prior Treatment) HPI Comments: [name] is a 38 y.o. male who presents to the Emergency Department requesting refill of his medications.  He was seen here for same last month and was advised to f/u with health dept.  Patient states that he had appt on march 18 , but appt was changed to April and he has ran out of his depakote, hydrocodone, synthroid, and xanax 3 days ago.  He denies any symptoms at this time.    The history is provided by the patient.    Past Medical History  Diagnosis Date  . Anxiety disorder   . Chronic pain   . Pseudoseizure   . Polysubstance abuse   . Hypothyroidism   . Chronic back pain   . Chronic shoulder pain    History reviewed. No pertinent past surgical history. No family history on file. History  Substance Use Topics  . Smoking status: Current Every Day Smoker -- 1.00 packs/day    Types: Cigarettes  . Smokeless tobacco: Not on file  . Alcohol Use: No    Review of Systems  Constitutional: Negative for fever, chills and fatigue.  HENT: Negative for trouble swallowing.   Respiratory: Negative for cough, shortness of breath and wheezing.   Cardiovascular: Negative for chest pain and palpitations.  Gastrointestinal: Negative for nausea, vomiting and abdominal pain.  Genitourinary: Negative for dysuria, hematuria and flank pain.  Musculoskeletal: Negative for arthralgias, back pain, myalgias, neck pain and neck stiffness.  Skin: Negative for rash.  Neurological: Negative for dizziness, syncope, speech difficulty, weakness, numbness and headaches.  Hematological: Does not bruise/bleed easily.      Allergies  Chlorpromazine hcl; Haloperidol lactate; and Ketorolac  tromethamine  Home Medications   Current Outpatient Rx  Name  Route  Sig  Dispense  Refill  . ALPRAZolam (XANAX) 1 MG tablet   Oral   Take 1 mg by mouth 4 (four) times daily.           . divalproex (DEPAKOTE) 500 MG DR tablet   Oral   Take 500 mg by mouth 2 (two) times daily.         [name] Kitchen HYDROcodone-acetaminophen (NORCO) 10-325 MG per tablet   Oral   Take 1 tablet by mouth every 6 (six) hours as needed.         [name] Kitchen ibuprofen (ADVIL,MOTRIN) 200 MG tablet   Oral   Take 600 mg by mouth every 6 (six) hours as needed for moderate pain.         [name] Kitchen levothyroxine (SYNTHROID, LEVOTHROID) 100 MCG tablet   Oral   Take 100 mcg by mouth daily.            BP 126/75  Pulse 109  Temp(Src) 97.9 F (36.6 C) (Oral)  Resp 20  SpO2 96% Physical Exam  Nursing note and vitals reviewed. Constitutional: He is oriented to person, place, and time. He appears well-developed and well-nourished. No distress.  HENT:  Head: Normocephalic and atraumatic.  Mouth/Throat: Oropharynx is clear and moist.  Neck: Normal range of motion. Neck supple.  Cardiovascular: Normal rate, regular rhythm, normal heart sounds and intact distal pulses.   No murmur heard. Pulmonary/Chest: Effort normal and breath sounds normal. No respiratory distress.  Abdominal: Soft.  He exhibits no distension. There is no tenderness.  Musculoskeletal: Normal range of motion. He exhibits no tenderness.  Lymphadenopathy:    He has no cervical adenopathy.  Neurological: He is alert and oriented to person, place, and time. He exhibits normal muscle tone. Coordination normal.  Skin: Skin is warm and dry.    ED Course  Procedures (including critical care time) Labs Review Labs Reviewed - No data to display Imaging Review No results found.   EKG Interpretation None      MDM   Final diagnoses:  Medication refill    Previous ed chart reviewed.  No concerning sx's for withdrawal or benzo related seizure.    Pt is well  appearing.  VSS.  patient was advised on previous visit and again today, that ED is not proper f/u for medication refills and that he will need to make arrangements with another clinic if health dept is unable to see him.  I have given him referral list and specifically [facility] to contact on Monday to arrange f/u.    I have explained that I will prescribe enough medication for 4-5 days as a courtesy so that he can make other arrangements for follow-up   The patient appears reasonably screened and/or stabilized for discharge and I doubt any other medical condition or other [facility] requiring further screening, evaluation, or treatment in the ED at this time prior to discharge.    Akshat Minehart L. Trisha Mangleriplett, PA-C 10/01/13 1726

## 2013-10-01 NOTE — ED Provider Notes (Signed)
Medical screening examination/treatment/procedure(s) were performed by non-physician practitioner and as supervising physician I was immediately available for consultation/collaboration.   EKG Interpretation None       Marvion Bastidas F Chantalle Defilippo, MD 10/01/13 1945 

## 2013-11-19 ENCOUNTER — Emergency Department (HOSPITAL_COMMUNITY)
Admission: EM | Admit: 2013-11-19 | Discharge: 2013-11-19 | Disposition: A | Discharge status: Home / Self Care | Payer: Medicaid Other | Attending: Emergency Medicine | Admitting: Emergency Medicine

## 2013-11-19 ENCOUNTER — Encounter (HOSPITAL_COMMUNITY): Payer: Self-pay | Admitting: Emergency Medicine

## 2013-11-19 DIAGNOSIS — K029 Dental caries, unspecified: Secondary | ICD-10-CM | POA: Insufficient documentation

## 2013-11-19 DIAGNOSIS — G40909 Epilepsy, unspecified, not intractable, without status epilepticus: Secondary | ICD-10-CM | POA: Insufficient documentation

## 2013-11-19 DIAGNOSIS — K0889 Other specified disorders of teeth and supporting structures: Secondary | ICD-10-CM

## 2013-11-19 DIAGNOSIS — F17200 Nicotine dependence, unspecified, uncomplicated: Secondary | ICD-10-CM | POA: Insufficient documentation

## 2013-11-19 DIAGNOSIS — K0510 Chronic gingivitis, plaque induced: Secondary | ICD-10-CM | POA: Insufficient documentation

## 2013-11-19 DIAGNOSIS — G8929 Other chronic pain: Secondary | ICD-10-CM | POA: Insufficient documentation

## 2013-11-19 DIAGNOSIS — S025XXA Fracture of tooth (traumatic), initial encounter for closed fracture: Secondary | ICD-10-CM

## 2013-11-19 DIAGNOSIS — E039 Hypothyroidism, unspecified: Secondary | ICD-10-CM | POA: Insufficient documentation

## 2013-11-19 DIAGNOSIS — K0381 Cracked tooth: Secondary | ICD-10-CM | POA: Insufficient documentation

## 2013-11-19 DIAGNOSIS — K089 Disorder of teeth and supporting structures, unspecified: Secondary | ICD-10-CM | POA: Insufficient documentation

## 2013-11-19 DIAGNOSIS — Z79899 Other long term (current) drug therapy: Secondary | ICD-10-CM | POA: Insufficient documentation

## 2013-11-19 DIAGNOSIS — Z8659 Personal history of other mental and behavioral disorders: Secondary | ICD-10-CM | POA: Insufficient documentation

## 2013-11-19 MED ORDER — AMOXICILLIN 250 MG PO CAPS
500.0000 mg | ORAL_CAPSULE | Freq: Three times a day (TID) | ORAL | Status: DC
  Administered 2013-11-19: 500 mg via ORAL
  Filled 2013-11-19: qty 2

## 2013-11-19 MED ORDER — AMOXICILLIN 500 MG PO CAPS: 500.0000 mg | ORAL_CAPSULE | Freq: Three times a day (TID) | ORAL | Status: DC

## 2013-11-19 MED ORDER — HYDROCODONE-ACETAMINOPHEN 5-325 MG PO TABS: 1.0000 | ORAL_TABLET | ORAL | Status: DC | PRN

## 2013-11-19 MED ORDER — OXYCODONE-ACETAMINOPHEN 5-325 MG PO TABS
1.0000 | ORAL_TABLET | Freq: Once | ORAL | Status: AC
  Administered 2013-11-19: 1 via ORAL
  Filled 2013-11-19: qty 1

## 2013-11-19 NOTE — ED Notes (Signed)
Pt c/o right upper dental pain since Friday. Pt states he cracked his tooth while eating. Pt went dentist today but was told to come here for abx tx. Pt has dentist appointment scheduled for 5/20.

## 2013-11-19 NOTE — ED Provider Notes (Signed)
CSN: [id number]     Arrival date & time 11/19/13  1141 History  This chart was scribed for non-physician practitioner, Kerrie BuffaloHope Karalina Tift, FNP,working with Joya Gaskinsonald W Wickline, MD, by Karle PlumberJennifer Tensley, ED Scribe.  This patient was seen in room APA11/APA11 and the patient's care was started at 12:21 PM.  Chief Complaint  Patient presents with  . Dental Pain   The history is provided by the patient. No language interpreter was used.   HPI Comments:  [name] is a 38 y.o. male who presents to the Emergency Department complaining of severe aching upper right dental pain that started three days ago secondary to cracking the tooth while eating. He states he went to a dentist today but was advised to come here for antibiotic treatment. He denies any fever, trouble swallowing, or visual changes. He is scheduled for a dental appointment next week and has his appointment card with him.    Past Medical History  Diagnosis Date  . Anxiety disorder   . Chronic pain   . Pseudoseizure   . Polysubstance abuse   . Hypothyroidism   . Chronic back pain   . Chronic shoulder pain    History reviewed. No pertinent past surgical history. History reviewed. No pertinent family history. History  Substance Use Topics  . Smoking status: Current Every Day Smoker -- 1.00 packs/day    Types: Cigarettes  . Smokeless tobacco: Not on file  . Alcohol Use: No    Review of Systems  Constitutional: Negative for fever.  HENT: Positive for dental problem. Negative for sore throat and trouble swallowing.   Eyes: Negative for visual disturbance.  Gastrointestinal: Negative for nausea, vomiting and abdominal pain.  All other systems reviewed and are negative.   Allergies  Chlorpromazine hcl; Haloperidol lactate; and Ketorolac tromethamine  Home Medications   Prior to Admission medications   Medication Sig Start Date End Date Taking? Authorizing Provider  divalproex (DEPAKOTE) 500 MG DR tablet Take 500 mg by mouth  every morning.    Yes Historical Provider, MD  levothyroxine (SYNTHROID, LEVOTHROID) 100 MCG tablet Take 100 mcg by mouth daily.     Yes Historical Provider, MD   Triage Vitals: BP 136/77  Pulse 95  Temp(Src) 98.2 F (36.8 C) (Oral)  Resp 16  Ht 5\' 8"  (1.727 m)  Wt 250 lb (113.399 kg)  BMI 38.02 kg/m2  SpO2 96% Physical Exam  Nursing note and vitals reviewed. Constitutional: He is oriented to person, place, and time. He appears well-developed and well-nourished. No distress.  HENT:  Mouth/Throat: Uvula is midline and oropharynx is clear and moist. No posterior oropharyngeal erythema.    Broken #5 tooth with surrounding gingival erythema and edema.  Eyes: Conjunctivae and EOM are normal. Pupils are equal, round, and reactive to light.  Neck: Neck supple.  Cardiovascular: Normal rate.   Pulmonary/Chest: Effort normal and breath sounds normal. No respiratory distress.  Musculoskeletal: Normal range of motion.  Neurological: He is alert and oriented to person, place, and time. No cranial nerve deficit.  Skin: Skin is warm and dry.  Psychiatric: He has a normal mood and affect. His behavior is normal.    ED Course  Procedures (including critical care time) DIAGNOSTIC STUDIES: Oxygen Saturation is 96% on RA, normal by my interpretation.   COORDINATION OF CARE: 12:25 PM- Will prescribe pain medication and antibiotics. Pt verbalizes understanding and agrees to plan.  Medications - No data to display   MDM  38 y.o. male with dental caries,  gingivitis and pain. Will treat with antibiotics and pain medication and he will keep his follow up appointment with the dentist. He will return here as needed. Stable for discharge without further screening at this time.   I personally performed the services described in this documentation, which was scribed in my presence. The recorded information has been reviewed and is accurate.    [address]Chloeann Alfred GailM Sahvannah Rieser, [place]NP 11/20/13 [phone]

## 2013-11-19 NOTE — Discharge Instructions (Signed)
Dental Injury Your exam shows that you have injured your teeth. The treatment of broken teeth and other dental injuries depends on how badly they are hurt. All dental injuries should be checked as soon as possible by a dentist if there are:  Loose teeth which may need to be wired or bonded with a plastic device to hold them in place.  Broken teeth with exposed tooth pulp which may cause a serious infection.  Painful teeth especially when you bite or chew.  Sharp tooth edges that cut your tongue or lips. Sometimes, antibiotics or pain medicine are prescribed to prevent infection and control pain. Eat a soft or liquid diet and rinse your mouth out after meals with warm water. You should see a dentist or return here at once if you have increased swelling, increased pain or uncontrolled bleeding from the site of your injury. SEEK MEDICAL CARE IF:   You have increased pain not controlled with medicines.  You have swelling around your tooth, in your face or neck.  You have bleeding which starts, continues, or gets worse.  You have a fever. Document Released: 06/28/2005 Document Revised: 09/20/2011 Document Reviewed: 06/27/2009 [facility] Patient Information 2014 [place], [place].  Dental Pain A tooth ache may be caused by cavities (tooth decay). Cavities expose the nerve of the tooth to air and hot or cold temperatures. It may come from an infection or abscess (also called a boil or furuncle) around your tooth. It is also often caused by dental caries (tooth decay). This causes the pain you are having. DIAGNOSIS  Your caregiver can diagnose this problem by exam. TREATMENT   If caused by an infection, it may be treated with medications which kill germs (antibiotics) and pain medications as prescribed by your caregiver. Take medications as directed.  Only take over-the-counter or prescription medicines for pain, discomfort, or fever as directed by your caregiver.  Whether the tooth ache today  is caused by infection or dental disease, you should see your dentist as soon as possible for further care. SEEK MEDICAL CARE IF: The exam and treatment you received today has been provided on an emergency basis only. This is not a substitute for complete medical or dental care. If your problem worsens or new problems (symptoms) appear, and you are unable to meet with your dentist, call or return to this location. SEEK IMMEDIATE MEDICAL CARE IF:   You have a fever.  You develop redness and swelling of your face, jaw, or neck.  You are unable to open your mouth.  You have severe pain uncontrolled by pain medicine. MAKE SURE YOU:   Understand these instructions.  Will watch your condition.  Will get help right away if you are not doing well or get worse. Document Released: 06/28/2005 Document Revised: 09/20/2011 Document Reviewed: 02/14/2008 [facility] Patient Information 2014 [place], [place].

## 2013-11-19 NOTE — ED Notes (Addendum)
Pt broke rt upper tooth when eating on Friday. Dental pain since then.  Says he has appt for pain clinic

## 2013-11-20 NOTE — ED Provider Notes (Signed)
Medical screening examination/treatment/procedure(s) were performed by non-physician practitioner and as supervising physician I was immediately available for consultation/collaboration.   EKG Interpretation None        Joya Gaskinsonald W Jailen Coward, MD 11/20/13 1239

## 2013-12-20 ENCOUNTER — Encounter (HOSPITAL_COMMUNITY): Payer: Self-pay | Admitting: Emergency Medicine

## 2013-12-20 ENCOUNTER — Emergency Department (HOSPITAL_COMMUNITY): Payer: Medicaid Other

## 2013-12-20 ENCOUNTER — Emergency Department (HOSPITAL_COMMUNITY)
Admission: EM | Admit: 2013-12-20 | Discharge: 2013-12-20 | Disposition: A | Discharge status: Home / Self Care | Payer: Medicaid Other | Attending: Emergency Medicine | Admitting: Emergency Medicine

## 2013-12-20 DIAGNOSIS — Y929 Unspecified place or not applicable: Secondary | ICD-10-CM | POA: Insufficient documentation

## 2013-12-20 DIAGNOSIS — S4980XA Other specified injuries of shoulder and upper arm, unspecified arm, initial encounter: Secondary | ICD-10-CM | POA: Insufficient documentation

## 2013-12-20 DIAGNOSIS — S46909A Unspecified injury of unspecified muscle, fascia and tendon at shoulder and upper arm level, unspecified arm, initial encounter: Secondary | ICD-10-CM | POA: Insufficient documentation

## 2013-12-20 DIAGNOSIS — W208XXA Other cause of strike by thrown, projected or falling object, initial encounter: Secondary | ICD-10-CM | POA: Insufficient documentation

## 2013-12-20 DIAGNOSIS — Y9389 Activity, other specified: Secondary | ICD-10-CM | POA: Insufficient documentation

## 2013-12-20 DIAGNOSIS — F17200 Nicotine dependence, unspecified, uncomplicated: Secondary | ICD-10-CM | POA: Insufficient documentation

## 2013-12-20 DIAGNOSIS — Z765 Malingerer [conscious simulation]: Secondary | ICD-10-CM | POA: Insufficient documentation

## 2013-12-20 MED ORDER — FENTANYL CITRATE 0.05 MG/ML IJ SOLN
50.0000 ug | Freq: Once | INTRAMUSCULAR | Status: AC
  Administered 2013-12-20: 50 ug via INTRAVENOUS
  Filled 2013-12-20: qty 2

## 2013-12-20 MED ORDER — FENTANYL CITRATE 0.05 MG/ML IJ SOLN: 50.0000 ug | Freq: Once | INTRAMUSCULAR | Status: DC

## 2013-12-20 NOTE — Discharge Instructions (Signed)
You can use ice or heat to your shoulder. You can take acetaminophen 1000 mg + ibuprofen for pain if needed.

## 2013-12-20 NOTE — ED Notes (Signed)
[employer] and EDP at bedside to discuss pt attempting to use multiple name/dob/ssn to obtain prescriptions for pain medication. Pt instructed not to return to [facility] unless he is having a true emergency or he will be arrested for 2nd degree trespassing.

## 2013-12-20 NOTE — ED Provider Notes (Signed)
CSN: [id number]     Arrival date & time 12/20/13  1133 History  This chart was scribed for Ward Givens, MD by Joaquin Music, ED Scribe. This patient was seen in room APA02/APA02 and the patient's care was started at 11:49 AM.   Chief Complaint  Patient presents with  . Shoulder Injury   The history is provided by the patient. No language interpreter was used.   HPI Comments: [name] is a 38 y.o. male with hx of seizures who presents to the Emergency Department complaining of sudden R shoulder injury that occurred 1-2 hours ago. Pt states he was working under a race car, the jack stand fell onto R shoulder, and had car lifted with L hand and his brother helping. He c/o numbness to R finger tips. Left hand dominant. Hx of seizures; last seizure was 10-15 years ago. He reports having 2 teeth extracted 2-3 days ago when asked about bruising in his left antecubital area of his arm. Smokes 2ppd cigarettes. Takes synthroid thyroid; denies other daily medications. Currently not employed. Denies trouble breathing and SOB.   PCP: none, recently moved from [place].  History reviewed. No pertinent past medical history. History reviewed. No pertinent past surgical history. No family history on file. History  Substance Use Topics  . Smoking status: Current Every Day Smoker    Types: Cigarettes  . Smokeless tobacco: Not on file  . Alcohol Use: No  smokes 3 ppd Unemployed Denies ETOH  Review of Systems  Respiratory: Negative for apnea, chest tightness and shortness of breath.   Skin: Positive for color change. Negative for wound.  All other systems reviewed and are negative.  Allergies  Haldol; Thorazine; Toradol; and Tramadol  Home Medications   Prior to Admission medications   Not on File   synthroid  BP 130/77  Pulse 97  Temp(Src) 98.2 F (36.8 C) (Oral)  Resp 18  Ht 5\' 9"  (1.753 m)  Wt 200 lb (90.719 kg)  BMI 29.52 kg/m2  SpO2 99%  Vital signs normal     Physical Exam  Nursing note and vitals reviewed. Constitutional: He is oriented to person, place, and time. He appears well-developed and well-nourished.  Non-toxic appearance. He does not appear ill. No distress.  HENT:  Head: Normocephalic and atraumatic.  Right Ear: External ear normal.  Left Ear: External ear normal.  Nose: Nose normal. No mucosal edema or rhinorrhea.  Mouth/Throat: Oropharynx is clear and moist and mucous membranes are normal. No dental abscesses or uvula swelling.  Eyes: Conjunctivae and EOM are normal. Pupils are equal, round, and reactive to light.  Neck: Normal range of motion and full passive range of motion without pain. Neck supple.  Cardiovascular: Normal rate, regular rhythm and normal heart sounds.  Exam reveals no gallop and no friction rub.   No murmur heard. Pulmonary/Chest: Effort normal and breath sounds normal. No respiratory distress. He has no wheezes. He has no rhonchi. He has no rales. He exhibits no tenderness and no crepitus.  Abdominal: Soft. Normal appearance and bowel sounds are normal. He exhibits no distension. There is no tenderness. There is no rebound and no guarding.  Musculoskeletal: Normal range of motion. He exhibits no edema and no tenderness.  He has a winged scapula to the R. Hard to feel his anatomy because of his posititon. Feels as though he has an empty shoulder joint but he is leaning to his right and forward.  At the end of my interview, the shoulder blade  seemed to have gone back in place and his shoulder feels like it is in place. There is no bruising or abrasions or bruising to his chest/ shoulder area.   Neurological: He is alert and oriented to person, place, and time. He has normal strength. No cranial nerve deficit.  Skin: Skin is warm, dry and intact. No rash noted. No erythema. No pallor.  Patient has a bruised areas on his arms consistent with prior IV starts.  Psychiatric: He has a normal mood and affect. His speech  is normal and behavior is normal. His mood appears not anxious.   ED Course  Procedures DIAGNOSTIC STUDIES: Oxygen Saturation is 98% on RA, normal by my interpretation.    COORDINATION OF CARE: 11:54 AM-Discussed treatment plan which includes shoulder X-ray and IV medications. Pt agreed to plan.   Registration states patient has 2 or 3 charts. He gave a different birth date today and said he did not have an ID on him today. He stated the picture on the other charts were not him.  12:11 PM- I [place] patient in [place] [employer]. He states he just moved here a month ago. He has been getting narcotic prescriptions since 07/09/2013. He has gotten 6 narcotic prescriptions, including # 60 nucynta 75 mg tabs prescribed by Dr. Gerilyn Pilgrimoonquah on May 18 for 30 days supply. He also has received 5 prescriptions for alprazolam and Valium, the last was valium 5 mg tablets prescribed on 5/18 by Dr. Gerilyn Pilgrimoonquah. Although patient states he did not have a doctor when I went back into the room and asked him when his next appointment with Dr. Gerilyn Pilgrimoonquah was he states "May 15 or 16". He then denies having seen Dr. Gerilyn Pilgrim[place]quah before. He then states "are you crazy?" [place] [address] [place], [place], [place]. He has used 6 pharmacies to fill his medications.  Patient was given a copy of his x-rays which were read as normal. I was going to CT his shoulder and chest because of this possible winged scapula however after the initial exam his shoulder blade remained flat on his back.  Patient continued to be very demanding for pain medication. His exam changed from being suggestive of a shoulder injury to being normal. At this point it was felt patient was displaying drug seeking behavior. [place]. They know the patient and states [place] has lived in [place] his life. They escorted him off  the hospital property. He was instructed to only return for acute medical emergency, or he would be arrested for trespassing. Pt gave police his drivers license which he earlier said he didn't have.   Labs Review Labs Reviewed - No data to display  Imaging Review Dg Chest 1 View  12/20/2013   CLINICAL DATA:  Right upper chest pain.  EXAM: CHEST - 1 VIEW  COMPARISON:  None.  FINDINGS: The cardiac silhouette, mediastinal and hilar contours are within normal limits. The lungs are clear. The bony thorax is intact.  IMPRESSION: No acute cardiopulmonary findings   Electronically Signed   By: Loralie ChampagneMark  Gallerani M.D.   On: 12/20/2013 12:47   Dg Shoulder Right  12/20/2013   CLINICAL DATA:  Shoulder pain.  EXAM: RIGHT SHOULDER - 2+ VIEW  COMPARISON:  None.  FINDINGS: The joint spaces are maintained. No acute fractures identified. The visualized right lung is clear.  IMPRESSION: No acute fracture.   Electronically Signed  By: Loralie Champagne M.D.   On: 12/20/2013 12:46     EKG Interpretation None     MDM   Final diagnoses:  Drug-seeking behavior    Plan discharge  Devoria Albe, MD, FACEP   I personally performed the services described in this documentation, which was scribed in my presence. The recorded information has been reviewed and considered.  Devoria Albe, MD, Armando Gang    Ward Givens, MD 12/20/13 [phone]

## 2013-12-20 NOTE — ED Notes (Signed)
Patient wants to speak with the MD and wants some pain medicine at this time.

## 2013-12-20 NOTE — ED Notes (Signed)
Pt refuses to go to CT

## 2013-12-20 NOTE — ED Notes (Signed)
Dr. Lynelle Doctor into assess patient. Pt anxious/flight of ideas. VSS. No acute distress.

## 2013-12-20 NOTE — ED Notes (Signed)
Pt states he was under a car and jack stand fell, injuring right shoulder. Pt states numbness to fingers. Radial pulse present.

## 2013-12-21 ENCOUNTER — Encounter (HOSPITAL_COMMUNITY): Payer: Self-pay | Admitting: Emergency Medicine

## 2014-01-01 ENCOUNTER — Other Ambulatory Visit: Payer: Self-pay | Admitting: Neurology

## 2014-01-01 ENCOUNTER — Ambulatory Visit (HOSPITAL_COMMUNITY)
Admission: RE | Admit: 2014-01-01 | Discharge: 2014-01-01 | Disposition: A | Discharge status: Home / Self Care | Payer: Medicaid Other | Source: Ambulatory Visit | Attending: Neurology | Admitting: Neurology

## 2014-01-01 DIAGNOSIS — M545 Low back pain: Secondary | ICD-10-CM

## 2014-01-01 DIAGNOSIS — M5137 Other intervertebral disc degeneration, lumbosacral region: Secondary | ICD-10-CM | POA: Insufficient documentation

## 2014-01-01 DIAGNOSIS — S22009A Unspecified fracture of unspecified thoracic vertebra, initial encounter for closed fracture: Secondary | ICD-10-CM | POA: Insufficient documentation

## 2014-01-01 DIAGNOSIS — X58XXXA Exposure to other specified factors, initial encounter: Secondary | ICD-10-CM | POA: Insufficient documentation

## 2014-01-01 DIAGNOSIS — M51379 Other intervertebral disc degeneration, lumbosacral region without mention of lumbar back pain or lower extremity pain: Secondary | ICD-10-CM | POA: Insufficient documentation

## 2014-01-01 DIAGNOSIS — Y929 Unspecified place or not applicable: Secondary | ICD-10-CM | POA: Insufficient documentation

## 2014-01-01 DIAGNOSIS — R000 Tachycardia, unspecified: Secondary | ICD-10-CM

## 2014-01-01 DIAGNOSIS — K259 Gastric ulcer, unspecified as acute or chronic, without hemorrhage or perforation: Secondary | ICD-10-CM

## 2014-01-01 DIAGNOSIS — F319 Bipolar disorder, unspecified: Secondary | ICD-10-CM

## 2014-02-19 ENCOUNTER — Emergency Department (HOSPITAL_COMMUNITY)
Admission: EM | Admit: 2014-02-19 | Discharge: 2014-02-19 | Disposition: A | Discharge status: Home / Self Care | Payer: Medicaid Other | Attending: Emergency Medicine | Admitting: Emergency Medicine

## 2014-02-19 ENCOUNTER — Encounter (HOSPITAL_COMMUNITY): Payer: Self-pay | Admitting: Emergency Medicine

## 2014-02-19 DIAGNOSIS — M549 Dorsalgia, unspecified: Secondary | ICD-10-CM | POA: Insufficient documentation

## 2014-02-19 DIAGNOSIS — G8929 Other chronic pain: Secondary | ICD-10-CM | POA: Insufficient documentation

## 2014-02-19 DIAGNOSIS — M545 Low back pain, unspecified: Secondary | ICD-10-CM | POA: Insufficient documentation

## 2014-02-19 DIAGNOSIS — G40909 Epilepsy, unspecified, not intractable, without status epilepticus: Secondary | ICD-10-CM | POA: Insufficient documentation

## 2014-02-19 DIAGNOSIS — F1210 Cannabis abuse, uncomplicated: Secondary | ICD-10-CM | POA: Diagnosis not present

## 2014-02-19 DIAGNOSIS — E039 Hypothyroidism, unspecified: Secondary | ICD-10-CM | POA: Diagnosis not present

## 2014-02-19 DIAGNOSIS — F419 Anxiety disorder, unspecified: Secondary | ICD-10-CM

## 2014-02-19 DIAGNOSIS — Z79899 Other long term (current) drug therapy: Secondary | ICD-10-CM | POA: Insufficient documentation

## 2014-02-19 DIAGNOSIS — F411 Generalized anxiety disorder: Secondary | ICD-10-CM | POA: Insufficient documentation

## 2014-02-19 DIAGNOSIS — F17200 Nicotine dependence, unspecified, uncomplicated: Secondary | ICD-10-CM | POA: Insufficient documentation

## 2014-02-19 MED ORDER — ALPRAZOLAM 0.5 MG PO TABS
1.0000 mg | ORAL_TABLET | Freq: Once | ORAL | Status: AC
  Administered 2014-02-19: 1 mg via ORAL
  Filled 2014-02-19: qty 2

## 2014-02-19 MED ORDER — LEVOTHYROXINE SODIUM 100 MCG PO TABS: 100.0000 ug | ORAL_TABLET | Freq: Every day | ORAL | Status: DC

## 2014-02-19 MED ORDER — OXYCODONE-ACETAMINOPHEN 10-325 MG PO TABS: ORAL_TABLET | ORAL | Status: DC

## 2014-02-19 MED ORDER — OXYCODONE-ACETAMINOPHEN 5-325 MG PO TABS
2.0000 | ORAL_TABLET | Freq: Once | ORAL | Status: AC
  Administered 2014-02-19: 2 via ORAL
  Filled 2014-02-19: qty 2

## 2014-02-19 MED ORDER — ALPRAZOLAM 1 MG PO TABS: 1.0000 mg | ORAL_TABLET | Freq: Two times a day (BID) | ORAL | Status: DC | PRN

## 2014-02-19 NOTE — ED Notes (Signed)
Attempted to start IV and draw labs. Pt. Refusing, states he doesn't take depakote as he told earlier in his assessment. States he only needs refills on percocet, xanax, and thyroid meds. EDP aware.

## 2014-02-19 NOTE — ED Notes (Signed)
Pt. Refusing to be put on cardiac monitor, states "he is ok", just needs to take his meds, which he states are xanax, percocet and thyroid meds

## 2014-02-19 NOTE — ED Notes (Signed)
Pt. Removed armband, refusing to be scanned.

## 2014-02-19 NOTE — ED Notes (Signed)
Pt. Continues to press call bell multiple times, pt. Always reassured and plan of care updated with pt.

## 2014-02-19 NOTE — ED Notes (Signed)
Spoke with Dr. Aileen PilotZammitt about pt. Refusal for cardiac monitor, IV or lab work.

## 2014-02-19 NOTE — ED Notes (Signed)
Pt. Refusing vital signs being taking, just "wants his meds".

## 2014-02-19 NOTE — ED Notes (Signed)
Pt. Standing in hallway wanting to know when he will see EDP, pt. Updated again and reassured, pt. Escorted back to room, pt. Refusing to stay in bed with padded rails.

## 2014-02-19 NOTE — ED Notes (Signed)
Pt reports started having seizures today, per pt, was told by family that he had 2 seizures pta.  Pt reports being out of xanax, percocet, and thyroid medicine x 2 days.  Reporting dizziness at this time.  Alert and oriented x 4.

## 2014-02-19 NOTE — ED Notes (Signed)
Pt. Bed rails padded with blankets.

## 2014-02-19 NOTE — ED Notes (Signed)
RN called to waiting room by registration, reporting pt is having seizure.  Upon assessment, pt slumped over to the side in chair with eyes closed, after calling pt by name, all over body jerks began with left hand constriction, last approx 5-10 secs.  Once jerking movements subsided, pt appeared to be postictal holding head, with eyes slightly open but would not respond to questions.  Pt was able to stand with 2 person assist to get from chair to stretcher.  Pt is currently alert but does not answer questions.

## 2014-02-19 NOTE — ED Notes (Signed)
Pt. Watching tv during assessment, will not answer all questions or make eye contact

## 2014-02-19 NOTE — ED Notes (Signed)
Pt. In restroom come out and another pt. Went in and said someone had been smoking in restroom, pt. Denied smoking when asked by pt. [facility]ocate.

## 2014-02-19 NOTE — Discharge Instructions (Signed)
Follow up with a family md in 1-2 weeks °

## 2014-02-19 NOTE — ED Notes (Signed)
Pt. Continues to stand in hallway waiting for discharge paper, pt. Redirected to room and told discharge papers will be given as soon as I have them.

## 2014-02-20 NOTE — ED Provider Notes (Signed)
CSN: [id number]     Arrival date & time 02/19/14  1037 History   First MD Initiated Contact with Patient 02/19/14 1258     Chief Complaint  Patient presents with  . Seizures     (Consider location/radiation/quality/duration/timing/severity/associated sxs/prior Treatment) Patient is a 38 y.o. male presenting with back pain. The history is provided by the patient (the pt comlains of back pain.  he is also out of his xanax,  pain meds and synthroid).  Back Pain Location:  Generalized Quality:  Aching Pain severity:  Moderate Pain is:  Same all the time Onset quality:  Gradual Timing:  Constant Progression:  Waxing and waning Chronicity:  Recurrent Context: not emotional stress   Associated symptoms: no abdominal pain, no chest pain and no headaches     Past Medical History  Diagnosis Date  . Anxiety disorder   . Chronic pain   . Pseudoseizure   . Polysubstance abuse   . Hypothyroidism   . Chronic back pain   . Chronic shoulder pain    History reviewed. No pertinent past surgical history. No family history on file. History  Substance Use Topics  . Smoking status: Current Every Day Smoker -- 1.00 packs/day    Types: Cigarettes  . Smokeless tobacco: Not on file  . Alcohol Use: No    Review of Systems  Constitutional: Negative for appetite change and fatigue.  HENT: Negative for congestion, ear discharge and sinus pressure.   Eyes: Negative for discharge.  Respiratory: Negative for cough.   Cardiovascular: Negative for chest pain.  Gastrointestinal: Negative for abdominal pain and diarrhea.  Genitourinary: Negative for frequency and hematuria.  Musculoskeletal: Positive for back pain.  Skin: Negative for rash.  Neurological: Negative for seizures and headaches.  Psychiatric/Behavioral: Negative for hallucinations.      Allergies  Chlorpromazine hcl; Haloperidol lactate; Ketorolac tromethamine; Haldol; Thorazine; Toradol; and Tramadol  Home Medications   Prior  to Admission medications   Medication Sig Start Date End Date Taking? Authorizing Provider  ALPRAZolam Prudy Feeler(XANAX) 1 MG tablet Take 1 mg by mouth 3 (three) times daily.   Yes Historical Provider, MD  divalproex (DEPAKOTE) 500 MG DR tablet Take 500 mg by mouth every morning.    Yes Historical Provider, MD  levothyroxine (SYNTHROID, LEVOTHROID) 100 MCG tablet Take 100 mcg by mouth daily.     Yes Historical Provider, MD  oxyCODONE-acetaminophen (PERCOCET) 10-325 MG per tablet Take 1 tablet by mouth every 6 (six) hours as needed for pain.   Yes Historical Provider, MD  ALPRAZolam Prudy Feeler(XANAX) 1 MG tablet Take 1 tablet (1 mg total) by mouth 2 (two) times daily as needed for anxiety. 02/19/14   Benny LennertJoseph L Ladd Cen, MD  levothyroxine (SYNTHROID, LEVOTHROID) 100 MCG tablet Take 1 tablet (100 mcg total) by mouth daily before breakfast. 02/19/14   Benny LennertJoseph L Gem Conkle, MD  oxyCODONE-acetaminophen (PERCOCET) 10-325 MG per tablet Take one twice a day as needed for pain 02/19/14   Benny LennertJoseph L Keeyon Privitera, MD   BP 137/83  Pulse 78  Temp(Src) 98.4 F (36.9 C) (Oral)  Resp 16  Ht 5\' 9"  (1.753 m)  Wt 250 lb (113.399 kg)  BMI 36.90 kg/m2  SpO2 100% Physical Exam  Constitutional: He is oriented to person, place, and time. He appears well-developed.  HENT:  Head: Normocephalic.  Eyes: Conjunctivae and EOM are normal. No scleral icterus.  Neck: Neck supple. No thyromegaly present.  Cardiovascular: Normal rate and regular rhythm.  Exam reveals no gallop and no friction rub.  No murmur heard. Pulmonary/Chest: No stridor. He has no wheezes. He has no rales. He exhibits no tenderness.  Abdominal: He exhibits no distension. There is no tenderness. There is no rebound.  Musculoskeletal: Normal range of motion. He exhibits no edema.  Mild tender lumbar spine  Lymphadenopathy:    He has no cervical adenopathy.  Neurological: He is oriented to person, place, and time. He exhibits normal muscle tone. Coordination normal.  Skin: No rash  noted. No erythema.  Psychiatric: He has a normal mood and affect. His behavior is normal.    ED Course  Procedures (including critical care time) Labs Review Labs Reviewed - No data to display  Imaging Review No results found.   EKG Interpretation None      MDM   Final diagnoses:  Anxiety  Midline low back pain without sciatica        Benny Lennert, MD 02/20/14 1541

## 2014-02-27 ENCOUNTER — Encounter (HOSPITAL_COMMUNITY): Payer: Self-pay | Admitting: Emergency Medicine

## 2014-02-27 ENCOUNTER — Emergency Department (HOSPITAL_COMMUNITY)
Admission: EM | Admit: 2014-02-27 | Discharge: 2014-02-28 | Disposition: A | Discharge status: Home / Self Care | Payer: Medicaid Other | Attending: Emergency Medicine | Admitting: Emergency Medicine

## 2014-02-27 ENCOUNTER — Emergency Department (HOSPITAL_COMMUNITY): Payer: Medicaid Other

## 2014-02-27 DIAGNOSIS — Y9289 Other specified places as the place of occurrence of the external cause: Secondary | ICD-10-CM | POA: Diagnosis not present

## 2014-02-27 DIAGNOSIS — Y9389 Activity, other specified: Secondary | ICD-10-CM | POA: Diagnosis not present

## 2014-02-27 DIAGNOSIS — G8929 Other chronic pain: Secondary | ICD-10-CM | POA: Diagnosis not present

## 2014-02-27 DIAGNOSIS — Z79899 Other long term (current) drug therapy: Secondary | ICD-10-CM | POA: Diagnosis not present

## 2014-02-27 DIAGNOSIS — M25511 Pain in right shoulder: Secondary | ICD-10-CM

## 2014-02-27 DIAGNOSIS — W208XXA Other cause of strike by thrown, projected or falling object, initial encounter: Secondary | ICD-10-CM | POA: Diagnosis not present

## 2014-02-27 DIAGNOSIS — F411 Generalized anxiety disorder: Secondary | ICD-10-CM | POA: Diagnosis not present

## 2014-02-27 DIAGNOSIS — S46909A Unspecified injury of unspecified muscle, fascia and tendon at shoulder and upper arm level, unspecified arm, initial encounter: Secondary | ICD-10-CM | POA: Insufficient documentation

## 2014-02-27 DIAGNOSIS — S4980XA Other specified injuries of shoulder and upper arm, unspecified arm, initial encounter: Secondary | ICD-10-CM | POA: Insufficient documentation

## 2014-02-27 DIAGNOSIS — F17200 Nicotine dependence, unspecified, uncomplicated: Secondary | ICD-10-CM | POA: Insufficient documentation

## 2014-02-27 DIAGNOSIS — E039 Hypothyroidism, unspecified: Secondary | ICD-10-CM | POA: Insufficient documentation

## 2014-02-27 MED ORDER — HYDROMORPHONE HCL PF 1 MG/ML IJ SOLN
1.0000 mg | Freq: Once | INTRAMUSCULAR | Status: AC
  Administered 2014-02-28: 1 mg via INTRAMUSCULAR
  Filled 2014-02-27: qty 1
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
## 2014-02-27 NOTE — ED Notes (Signed)
Pt states he was working on his car and the jack gave way and the car fell on his right shoulder. Obvious deformity noted. Pt in significant pain.

## 2014-02-28 ENCOUNTER — Emergency Department (HOSPITAL_COMMUNITY): Payer: Medicaid Other

## 2014-02-28 ENCOUNTER — Other Ambulatory Visit (HOSPITAL_COMMUNITY): Payer: Medicaid Other

## 2014-02-28 MED ORDER — ONDANSETRON 4 MG PO TBDP
4.0000 mg | ORAL_TABLET | Freq: Once | ORAL | Status: AC
  Administered 2014-02-28: 4 mg via ORAL
  Filled 2014-02-28: qty 1

## 2014-02-28 MED ORDER — HYDROMORPHONE HCL PF 1 MG/ML IJ SOLN
1.0000 mg | Freq: Once | INTRAMUSCULAR | Status: AC
  Administered 2014-02-28: 1 mg via INTRAMUSCULAR
  Filled 2014-02-28: qty 1

## 2014-02-28 NOTE — ED Notes (Signed)
Pt reporting nausea, requested Zofran, Dr. Jodi MourningZavitz to place order.

## 2014-02-28 NOTE — ED Notes (Signed)
Pt to CT at this time.

## 2014-02-28 NOTE — Discharge Instructions (Signed)
If you were given medicines take as directed.  If you are on coumadin or contraceptives realize their levels and effectiveness is altered by many different medicines.  If you have any reaction (rash, tongues swelling, other) to the medicines stop taking and see a physician.   Please follow up as directed and return to the ER or see a physician for new or worsening symptoms.  Thank you. Filed Vitals:   02/27/14 2251  BP: 134/98  Pulse: 117  Temp: 98.3 F (36.8 C)  TempSrc: Oral  Resp: 98  SpO2: 97%

## 2014-03-02 ENCOUNTER — Emergency Department (HOSPITAL_COMMUNITY)
Admission: EM | Admit: 2014-03-02 | Discharge: 2014-03-03 | Discharge status: Against Medical Advice | Payer: Medicaid Other | Attending: Emergency Medicine | Admitting: Emergency Medicine

## 2014-03-02 ENCOUNTER — Encounter (HOSPITAL_COMMUNITY): Payer: Self-pay | Admitting: Emergency Medicine

## 2014-03-02 ENCOUNTER — Emergency Department (HOSPITAL_COMMUNITY): Admission: EM | Admit: 2014-03-02 | Discharge: 2014-03-02 | Discharge status: Against Medical Advice | Payer: Self-pay

## 2014-03-02 ENCOUNTER — Emergency Department (HOSPITAL_COMMUNITY): Payer: Medicaid Other

## 2014-03-02 DIAGNOSIS — Z765 Malingerer [conscious simulation]: Secondary | ICD-10-CM | POA: Diagnosis not present

## 2014-03-02 DIAGNOSIS — Y9389 Activity, other specified: Secondary | ICD-10-CM | POA: Diagnosis not present

## 2014-03-02 DIAGNOSIS — S4980XA Other specified injuries of shoulder and upper arm, unspecified arm, initial encounter: Secondary | ICD-10-CM | POA: Diagnosis present

## 2014-03-02 DIAGNOSIS — F17200 Nicotine dependence, unspecified, uncomplicated: Secondary | ICD-10-CM | POA: Diagnosis not present

## 2014-03-02 DIAGNOSIS — Y92410 Unspecified street and highway as the place of occurrence of the external cause: Secondary | ICD-10-CM | POA: Insufficient documentation

## 2014-03-02 DIAGNOSIS — Z79899 Other long term (current) drug therapy: Secondary | ICD-10-CM | POA: Insufficient documentation

## 2014-03-02 DIAGNOSIS — S46909A Unspecified injury of unspecified muscle, fascia and tendon at shoulder and upper arm level, unspecified arm, initial encounter: Secondary | ICD-10-CM | POA: Insufficient documentation

## 2014-03-02 MED ORDER — FENTANYL CITRATE 0.05 MG/ML IJ SOLN
100.0000 ug | Freq: Once | INTRAMUSCULAR | Status: AC
  Administered 2014-03-02: 100 ug via INTRAMUSCULAR
  Filled 2014-03-02: qty 2

## 2014-03-02 MED ORDER — SODIUM CHLORIDE 0.9 % IV BOLUS (SEPSIS): 1000.0000 mL | Freq: Once | INTRAVENOUS | Status: DC

## 2014-03-02 NOTE — ED Provider Notes (Signed)
Discussed with Dr. Margo AyeHall radiology. Patient has been seen multiple times for right shoulder injuries and had multiple x-rays and CAT scans. He changes his name and changes his birth date. Dr. Margo AyeHall has reviewed all the images and there is anatomic abnormalities in the patient's T-spine but correspond to this patient. Dr. Margo AyeHall reports patient [facility] that [facility]. He also had a [facility] at [facility] 2 days ago that was negative. Patient admits to nursing staff here that he has changed his name and birthdate at this property before.    Shawn OctaveStephen Caydance Kuehnle, MD 03/02/14 [phone]

## 2014-03-02 NOTE — ED Notes (Signed)
Pt refused to have x-rays done until he has pain meds.  Attempt x 2 for IV start without success.  Pt states he has had to have multiple IV attempts in the past
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
## 2014-03-03 NOTE — ED Notes (Signed)
Pt decided to leave after being confronted by having been seen at [facility] 2 days ago for his right shoulder and also at [facility] earlier today.  Pt signed out ama after speaking with [employer] officers about giving false information to hospital registration.  Pt denied that he was seen at [facility] or at [facility] recently.  [employer] took report regarding giving false information.

## 2014-03-06 ENCOUNTER — Emergency Department (HOSPITAL_COMMUNITY)
Admission: EM | Admit: 2014-03-06 | Discharge: 2014-03-06 | Discharge status: Against Medical Advice | Payer: Medicaid Other | Attending: Emergency Medicine | Admitting: Emergency Medicine

## 2014-03-06 ENCOUNTER — Ambulatory Visit (HOSPITAL_COMMUNITY): Admission: RE | Admit: 2014-03-06 | Payer: Medicaid Other | Source: Ambulatory Visit

## 2014-03-06 ENCOUNTER — Encounter (HOSPITAL_COMMUNITY): Payer: Self-pay | Admitting: Emergency Medicine

## 2014-03-06 ENCOUNTER — Emergency Department (HOSPITAL_COMMUNITY): Payer: Medicaid Other

## 2014-03-06 DIAGNOSIS — S4980XA Other specified injuries of shoulder and upper arm, unspecified arm, initial encounter: Secondary | ICD-10-CM | POA: Diagnosis not present

## 2014-03-06 DIAGNOSIS — Y9389 Activity, other specified: Secondary | ICD-10-CM | POA: Diagnosis not present

## 2014-03-06 DIAGNOSIS — Y9289 Other specified places as the place of occurrence of the external cause: Secondary | ICD-10-CM | POA: Diagnosis not present

## 2014-03-06 DIAGNOSIS — Z862 Personal history of diseases of the blood and blood-forming organs and certain disorders involving the immune mechanism: Secondary | ICD-10-CM | POA: Diagnosis not present

## 2014-03-06 DIAGNOSIS — Z8669 Personal history of other diseases of the nervous system and sense organs: Secondary | ICD-10-CM | POA: Insufficient documentation

## 2014-03-06 DIAGNOSIS — Z765 Malingerer [conscious simulation]: Secondary | ICD-10-CM

## 2014-03-06 DIAGNOSIS — S46909A Unspecified injury of unspecified muscle, fascia and tendon at shoulder and upper arm level, unspecified arm, initial encounter: Secondary | ICD-10-CM | POA: Insufficient documentation

## 2014-03-06 DIAGNOSIS — Z8639 Personal history of other endocrine, nutritional and metabolic disease: Secondary | ICD-10-CM | POA: Insufficient documentation

## 2014-03-06 HISTORY — DX: Disorder of thyroid, unspecified: E07.9

## 2014-03-06 HISTORY — DX: Epilepsy, unspecified, not intractable, without status epilepticus: G40.909

## 2014-03-06 HISTORY — DX: Unspecified convulsions: R56.9

## 2014-03-06 LAB — COMPREHENSIVE METABOLIC PANEL
ALK PHOS: 66 U/L (ref 39–117)
ALT: 30 U/L (ref 0–53)
AST: 20 U/L (ref 0–37)
Albumin: 3.9 g/dL (ref 3.5–5.2)
Anion gap: 14 (ref 5–15)
BUN: 11 mg/dL (ref 6–23)
CHLORIDE: 103 meq/L (ref 96–112)
CO2: 24 meq/L (ref 19–32)
CREATININE: 0.84 mg/dL (ref 0.50–1.35)
Calcium: 9.5 mg/dL (ref 8.4–10.5)
GFR calc Af Amer: >90 mL/min (ref >90)
Glucose, Bld: 90 mg/dL (ref 70–99)
POTASSIUM: 4.7 meq/L (ref 3.7–5.3)
SODIUM: 141 meq/L (ref 137–147)
Total Bilirubin: 0.5 mg/dL (ref 0.3–1.2)
Total Protein: 7.4 g/dL (ref 6.0–8.3)

## 2014-03-06 MED ORDER — HYDROMORPHONE HCL PF 1 MG/ML IJ SOLN
1.0000 mg | Freq: Once | INTRAMUSCULAR | Status: AC
  Administered 2014-03-06: 1 mg via INTRAVENOUS
  Filled 2014-03-06: qty 1

## 2014-03-06 MED ORDER — ONDANSETRON HCL 4 MG/2ML IJ SOLN
4.0000 mg | Freq: Once | INTRAMUSCULAR | Status: DC
  Filled 2014-03-06: qty 2
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
## 2014-03-06 NOTE — ED Notes (Signed)
Dr. Gwendolyn Grant and Dahlia Client PA at bedside explaining current results and stating patient will not get narcotic pain medication but offering other pain medication options. Pt. sts is aware.

## 2014-03-06 NOTE — ED Notes (Signed)
C-collar size small, medium and large placed on patient maintaining C-spine. Collars unable to properly fit due to patients short neck. Ortho paged for aspen collar fitting. Dahlia Client PA made aware

## 2014-03-06 NOTE — ED Notes (Addendum)
Patient gown, arm bracelet and aspen fitted c-collar on bed. Patient not seen. PA made aware.   Unable to find patient.

## 2014-03-06 NOTE — ED Notes (Signed)
Patient pushing call bell numerous times requesting narcotic pain medication. RN explaining to patient that provider is made aware and offering ice. Patient refusing.
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
## 2014-03-16 ENCOUNTER — Encounter (HOSPITAL_COMMUNITY): Payer: Self-pay | Admitting: Emergency Medicine

## 2014-03-16 ENCOUNTER — Emergency Department (HOSPITAL_COMMUNITY)
Admission: EM | Admit: 2014-03-16 | Discharge: 2014-03-17 | Disposition: A | Discharge status: Home / Self Care | Payer: Medicaid Other | Attending: Emergency Medicine | Admitting: Emergency Medicine

## 2014-03-16 ENCOUNTER — Emergency Department (HOSPITAL_COMMUNITY): Payer: Medicaid Other

## 2014-03-16 DIAGNOSIS — G8929 Other chronic pain: Secondary | ICD-10-CM | POA: Diagnosis not present

## 2014-03-16 DIAGNOSIS — S4980XA Other specified injuries of shoulder and upper arm, unspecified arm, initial encounter: Secondary | ICD-10-CM | POA: Diagnosis not present

## 2014-03-16 DIAGNOSIS — S46909A Unspecified injury of unspecified muscle, fascia and tendon at shoulder and upper arm level, unspecified arm, initial encounter: Secondary | ICD-10-CM | POA: Insufficient documentation

## 2014-03-16 DIAGNOSIS — Y929 Unspecified place or not applicable: Secondary | ICD-10-CM | POA: Insufficient documentation

## 2014-03-16 DIAGNOSIS — M25511 Pain in right shoulder: Secondary | ICD-10-CM

## 2014-03-16 DIAGNOSIS — Z79899 Other long term (current) drug therapy: Secondary | ICD-10-CM | POA: Diagnosis not present

## 2014-03-16 DIAGNOSIS — G40909 Epilepsy, unspecified, not intractable, without status epilepticus: Secondary | ICD-10-CM | POA: Diagnosis not present

## 2014-03-16 DIAGNOSIS — W11XXXA Fall on and from ladder, initial encounter: Secondary | ICD-10-CM | POA: Insufficient documentation

## 2014-03-16 DIAGNOSIS — F411 Generalized anxiety disorder: Secondary | ICD-10-CM | POA: Diagnosis not present

## 2014-03-16 DIAGNOSIS — Y939 Activity, unspecified: Secondary | ICD-10-CM | POA: Diagnosis not present

## 2014-03-16 DIAGNOSIS — E039 Hypothyroidism, unspecified: Secondary | ICD-10-CM | POA: Diagnosis not present

## 2014-03-16 DIAGNOSIS — F17200 Nicotine dependence, unspecified, uncomplicated: Secondary | ICD-10-CM | POA: Insufficient documentation

## 2014-03-16 MED ORDER — SODIUM CHLORIDE 0.9 % IV SOLN: Freq: Once | INTRAVENOUS | Status: DC

## 2014-03-16 MED ORDER — HYDROMORPHONE HCL PF 1 MG/ML IJ SOLN
1.0000 mg | Freq: Once | INTRAMUSCULAR | Status: DC
  Filled 2014-03-16: qty 1

## 2014-03-16 MED ORDER — DIAZEPAM 5 MG/ML IJ SOLN
5.0000 mg | Freq: Once | INTRAMUSCULAR | Status: DC
  Filled 2014-03-16: qty 2
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
## 2014-03-16 NOTE — ED Notes (Signed)
Pt refused VS  

## 2014-03-17 MED ORDER — CYCLOBENZAPRINE HCL 5 MG PO TABS: 5.0000 mg | ORAL_TABLET | Freq: Three times a day (TID) | ORAL | Status: DC | PRN

## 2014-03-17 MED ORDER — DIAZEPAM 5 MG/ML IJ SOLN
5.0000 mg | Freq: Once | INTRAMUSCULAR | Status: AC
  Administered 2014-03-17: 5 mg via INTRAMUSCULAR

## 2014-03-17 MED ORDER — HYDROMORPHONE HCL PF 1 MG/ML IJ SOLN
1.0000 mg | Freq: Once | INTRAMUSCULAR | Status: AC
  Administered 2014-03-17: 1 mg via INTRAMUSCULAR

## 2014-03-17 NOTE — ED Notes (Signed)
Patient upset because he could not get pain medicine. He stated I guess I will come back.

## 2014-03-17 NOTE — ED Provider Notes (Signed)
Medical screening examination/treatment/procedure(s) were performed by non-physician practitioner and as supervising physician I was immediately available for consultation/collaboration.     Geoffery Lyons, MD 03/17/14 [phone]

## 2014-03-17 NOTE — Discharge Instructions (Signed)
Arthralgia Arthralgia is joint pain. A joint is a place where two bones meet. Joint pain can happen for many reasons. The joint can be bruised, stiff, infected, or weak from aging. Pain usually goes away after resting and taking medicine for soreness.  HOME CARE  Rest the joint as told by your doctor.  Keep the sore joint raised (elevated) for the first 24 hours.  Put ice on the joint area.  Put ice in a plastic bag.  Place a towel between your skin and the bag.  Leave the ice on for 15-20 minutes, 03-04 times a day.  Wear your splint, casting, elastic bandage, or sling as told by your doctor.  Only take medicine as told by your doctor. Do not take aspirin.  Use crutches as told by your doctor. Do not put weight on the joint until told to by your doctor. GET HELP RIGHT AWAY IF:   You have bruising, puffiness (swelling), or more pain.  Your fingers or toes turn blue or start to lose feeling (numb).  Your medicine does not lessen the pain.  Your pain becomes severe.  You have a temperature by mouth above 102 F (38.9 C), not controlled by medicine.  You cannot move or use the joint. MAKE SURE YOU:   Understand these instructions.  Will watch your condition.  Will get help right away if you are not doing well or get worse. Document Released: 06/16/2009 Document Revised: 09/20/2011 Document Reviewed: 06/16/2009 [facility] Patient Information 2015 [place], [place]. This information is not intended to replace advice given to you by your health care provider. Make sure you discuss any questions you have with your health care provider. Your x rays are normal
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
## 2014-03-17 NOTE — ED Notes (Signed)
Patient refused vital signs. Gave patient a sandwich and something to drink.

## 2014-03-20 ENCOUNTER — Encounter (HOSPITAL_COMMUNITY): Payer: Self-pay | Admitting: Emergency Medicine

## 2014-03-31 ENCOUNTER — Emergency Department (HOSPITAL_COMMUNITY)
Admission: EM | Admit: 2014-03-31 | Discharge: 2014-03-31 | Disposition: A | Discharge status: Home / Self Care | Payer: Self-pay | Attending: Emergency Medicine | Admitting: Emergency Medicine

## 2014-03-31 ENCOUNTER — Encounter (HOSPITAL_COMMUNITY): Payer: Self-pay | Admitting: Emergency Medicine

## 2014-03-31 ENCOUNTER — Emergency Department (HOSPITAL_COMMUNITY): Payer: Self-pay

## 2014-03-31 DIAGNOSIS — Y9389 Activity, other specified: Secondary | ICD-10-CM | POA: Insufficient documentation

## 2014-03-31 DIAGNOSIS — S4980XA Other specified injuries of shoulder and upper arm, unspecified arm, initial encounter: Secondary | ICD-10-CM | POA: Insufficient documentation

## 2014-03-31 DIAGNOSIS — W11XXXA Fall on and from ladder, initial encounter: Secondary | ICD-10-CM | POA: Insufficient documentation

## 2014-03-31 DIAGNOSIS — Y9289 Other specified places as the place of occurrence of the external cause: Secondary | ICD-10-CM | POA: Insufficient documentation

## 2014-03-31 DIAGNOSIS — M2180 Other specified acquired deformities of unspecified limb: Secondary | ICD-10-CM | POA: Insufficient documentation

## 2014-03-31 DIAGNOSIS — F17200 Nicotine dependence, unspecified, uncomplicated: Secondary | ICD-10-CM | POA: Insufficient documentation

## 2014-03-31 DIAGNOSIS — S46909A Unspecified injury of unspecified muscle, fascia and tendon at shoulder and upper arm level, unspecified arm, initial encounter: Secondary | ICD-10-CM | POA: Insufficient documentation

## 2014-03-31 DIAGNOSIS — M958 Other specified acquired deformities of musculoskeletal system: Secondary | ICD-10-CM

## 2014-03-31 MED ORDER — HYDROMORPHONE HCL 1 MG/ML IJ SOLN
2.0000 mg | Freq: Once | INTRAMUSCULAR | Status: AC
  Administered 2014-03-31: 2 mg via INTRAMUSCULAR
  Filled 2014-03-31: qty 2

## 2014-03-31 MED ORDER — FENTANYL CITRATE 0.05 MG/ML IJ SOLN
50.0000 ug | Freq: Once | INTRAMUSCULAR | Status: AC
  Administered 2014-03-31: 50 ug via INTRAVENOUS
  Filled 2014-03-31: qty 2

## 2014-03-31 MED ORDER — HYDROCODONE-ACETAMINOPHEN 5-325 MG PO TABS: 1.0000 | ORAL_TABLET | Freq: Four times a day (QID) | ORAL | Status: DC | PRN

## 2014-03-31 MED ORDER — FENTANYL CITRATE 0.05 MG/ML IJ SOLN
50.0000 ug | Freq: Once | INTRAMUSCULAR | Status: DC
  Filled 2014-03-31: qty 2

## 2014-03-31 MED ORDER — FENTANYL CITRATE 0.05 MG/ML IJ SOLN
100.0000 ug | Freq: Once | INTRAMUSCULAR | Status: AC
  Administered 2014-03-31: 100 ug via INTRAMUSCULAR
  Filled 2014-03-31: qty 2

## 2014-03-31 MED ORDER — ONDANSETRON 4 MG PO TBDP
8.0000 mg | ORAL_TABLET | Freq: Once | ORAL | Status: AC
  Administered 2014-03-31: 8 mg via ORAL
  Filled 2014-03-31: qty 2

## 2014-03-31 MED ORDER — IBUPROFEN 600 MG PO TABS: 600.0000 mg | ORAL_TABLET | Freq: Four times a day (QID) | ORAL | Status: DC | PRN

## 2014-03-31 NOTE — ED Notes (Addendum)
Pt. fell from a ladder approx. 5 ft, this evening , no LOC / ambulatory , alert and oriented / respirations unlabored , presents with right upper arm / shoulder pain and right upper back pain .
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
## 2014-03-31 NOTE — ED Notes (Signed)
Pt c/o nausea.  
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
## 2014-04-01 ENCOUNTER — Emergency Department (HOSPITAL_COMMUNITY)
Admission: EM | Admit: 2014-04-01 | Discharge: 2014-04-01 | Discharge status: Against Medical Advice | Payer: Self-pay | Attending: Emergency Medicine | Admitting: Emergency Medicine

## 2014-04-01 ENCOUNTER — Encounter (HOSPITAL_COMMUNITY): Payer: Self-pay | Admitting: Emergency Medicine

## 2014-04-01 DIAGNOSIS — F17200 Nicotine dependence, unspecified, uncomplicated: Secondary | ICD-10-CM | POA: Insufficient documentation

## 2014-04-01 DIAGNOSIS — S4980XA Other specified injuries of shoulder and upper arm, unspecified arm, initial encounter: Secondary | ICD-10-CM | POA: Insufficient documentation

## 2014-04-01 DIAGNOSIS — Y9389 Activity, other specified: Secondary | ICD-10-CM | POA: Insufficient documentation

## 2014-04-01 DIAGNOSIS — S46909A Unspecified injury of unspecified muscle, fascia and tendon at shoulder and upper arm level, unspecified arm, initial encounter: Secondary | ICD-10-CM | POA: Insufficient documentation

## 2014-04-01 DIAGNOSIS — X503XXA Overexertion from repetitive movements, initial encounter: Secondary | ICD-10-CM | POA: Insufficient documentation

## 2014-04-01 DIAGNOSIS — X500XXA Overexertion from strenuous movement or load, initial encounter: Secondary | ICD-10-CM | POA: Insufficient documentation

## 2014-04-01 DIAGNOSIS — Y939 Activity, unspecified: Secondary | ICD-10-CM | POA: Insufficient documentation

## 2014-04-01 DIAGNOSIS — Y929 Unspecified place or not applicable: Secondary | ICD-10-CM | POA: Insufficient documentation

## 2014-04-01 DIAGNOSIS — X58XXXA Exposure to other specified factors, initial encounter: Secondary | ICD-10-CM | POA: Insufficient documentation
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
## 2014-04-25 ENCOUNTER — Telehealth: Payer: Self-pay | Admitting: Family Medicine
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
## 2014-06-06 ENCOUNTER — Emergency Department (HOSPITAL_COMMUNITY)
Admission: EM | Admit: 2014-06-06 | Discharge: 2014-06-06 | Disposition: A | Discharge status: Home / Self Care | Payer: Medicaid Other | Attending: Emergency Medicine | Admitting: Emergency Medicine

## 2014-06-06 ENCOUNTER — Encounter (HOSPITAL_COMMUNITY): Payer: Self-pay | Admitting: *Deleted

## 2014-06-06 DIAGNOSIS — F419 Anxiety disorder, unspecified: Secondary | ICD-10-CM | POA: Diagnosis not present

## 2014-06-06 DIAGNOSIS — G8929 Other chronic pain: Secondary | ICD-10-CM | POA: Insufficient documentation

## 2014-06-06 DIAGNOSIS — K088 Other specified disorders of teeth and supporting structures: Secondary | ICD-10-CM | POA: Diagnosis present

## 2014-06-06 DIAGNOSIS — K0889 Other specified disorders of teeth and supporting structures: Secondary | ICD-10-CM

## 2014-06-06 DIAGNOSIS — F606 Avoidant personality disorder: Secondary | ICD-10-CM | POA: Diagnosis not present

## 2014-06-06 DIAGNOSIS — M549 Dorsalgia, unspecified: Secondary | ICD-10-CM | POA: Insufficient documentation

## 2014-06-06 DIAGNOSIS — Z79899 Other long term (current) drug therapy: Secondary | ICD-10-CM | POA: Diagnosis not present

## 2014-06-06 DIAGNOSIS — E039 Hypothyroidism, unspecified: Secondary | ICD-10-CM | POA: Diagnosis not present

## 2014-06-06 DIAGNOSIS — G40909 Epilepsy, unspecified, not intractable, without status epilepticus: Secondary | ICD-10-CM | POA: Insufficient documentation

## 2014-06-06 DIAGNOSIS — Z720 Tobacco use: Secondary | ICD-10-CM | POA: Diagnosis not present

## 2014-06-06 HISTORY — DX: Malingerer (conscious simulation): Z76.5

## 2014-06-06 MED ORDER — CLINDAMYCIN HCL 300 MG PO CAPS: ORAL_CAPSULE | ORAL | Status: DC

## 2014-06-06 MED ORDER — CLINDAMYCIN HCL 150 MG PO CAPS
300.0000 mg | ORAL_CAPSULE | Freq: Once | ORAL | Status: DC
  Filled 2014-06-06: qty 2

## 2014-06-06 MED ORDER — ACETAMINOPHEN 500 MG PO TABS
1000.0000 mg | ORAL_TABLET | Freq: Once | ORAL | Status: DC
  Filled 2014-06-06: qty 2

## 2014-06-06 MED ORDER — IBUPROFEN 600 MG PO TABS: 600.0000 mg | ORAL_TABLET | Freq: Four times a day (QID) | ORAL | Status: DC | PRN

## 2014-06-06 MED ORDER — IBUPROFEN 800 MG PO TABS
800.0000 mg | ORAL_TABLET | Freq: Once | ORAL | Status: DC
Start: 2014-06-06 — End: 2014-06-06
  Filled 2014-06-06: qty 1

## 2014-06-06 NOTE — ED Notes (Signed)
EDP at bedside  

## 2014-06-06 NOTE — Discharge Instructions (Signed)
It is important that you see a dentist as soon as possible. Please use clindamycin 3 times daily with food. Please use 600 mg of ibuprofen, and 1000 mg of Tylenol,  Dental Pain Toothache is pain in or around a tooth. It may get worse with chewing or with cold or heat.  HOME CARE  Your dentist may use a numbing medicine during treatment. If so, you may need to avoid eating until the medicine wears off. Ask your dentist about this.  Only take medicine as told by your dentist or doctor.  Avoid chewing food near the painful tooth until after all treatment is done. Ask your dentist about this. GET HELP RIGHT AWAY IF:   The problem gets worse or new problems appear.  You have a fever.  There is redness and puffiness (swelling) of the face, jaw, or neck.  You cannot open your mouth.  There is pain in the jaw.  There is very bad pain that is not helped by medicine. MAKE SURE YOU:   Understand these instructions.  Will watch your condition.  Will get help right away if you are not doing well or get worse. Document Released: 12/15/2007 Document Revised: 09/20/2011 Document Reviewed: 12/15/2007 [facility] Patient Information 2015 [place], [place]. This information is not intended to replace advice given to you by your health care provider. Make sure you discuss any questions you have with your health care provider.  3 times daily for discomfort.

## 2014-06-06 NOTE — ED Provider Notes (Signed)
CSN: [id number]     Arrival date & time 06/06/14  1938 History   First MD Initiated Contact with Patient 06/06/14 1956     Chief Complaint  Patient presents with  . Dental Pain     (Consider location/radiation/quality/duration/timing/severity/associated sxs/prior Treatment) HPI Comments: Patient presents to the emergency department with complaint of toothache. The patient states that while eating earlier today he heard a pop and then noticed that one of the left upper molars had a loose fragment of tooth. He states that the pain is getting worse. He presents now for assistance. The patient states he has had some bleeding, was able to get the bleeding stopped. The patient also states that he has tried "gingerroot", but he states this has not been of any help.  The history is provided by the patient.    Past Medical History  Diagnosis Date  . Anxiety disorder   . Chronic pain   . Pseudoseizure   . Polysubstance abuse   . Hypothyroidism   . Chronic back pain   . Chronic shoulder pain   . Thyroid disease   . Seizure   . Epilepsy   . Drug-seeking behavior   . Malingering    History reviewed. No pertinent past surgical history. History reviewed. No pertinent family history. History  Substance Use Topics  . Smoking status: Current Every Day Smoker -- 1.00 packs/day    Types: Cigarettes  . Smokeless tobacco: Not on file  . Alcohol Use: No    Review of Systems  Constitutional: Negative for activity change.       All ROS Neg except as noted in HPI  HENT: Positive for dental problem.   Eyes: Negative for photophobia and discharge.  Respiratory: Negative for cough, shortness of breath and wheezing.   Cardiovascular: Negative for chest pain and palpitations.  Gastrointestinal: Negative for abdominal pain and blood in stool.  Genitourinary: Negative for dysuria, frequency and hematuria.  Musculoskeletal: Positive for back pain and arthralgias. Negative for neck pain.  Skin:  Negative.   Neurological: Positive for seizures. Negative for dizziness and speech difficulty.  Psychiatric/Behavioral: Negative for hallucinations and confusion. The patient is nervous/anxious.       Allergies  Chlorpromazine hcl; Haloperidol lactate; Ketorolac tromethamine; Haldol; Thorazine; Toradol; Tramadol; Haldol; and Toradol  Home Medications   Prior to Admission medications   Medication Sig Start Date End Date Taking? Authorizing Provider  ALPRAZolam Prudy Feeler(XANAX) 1 MG tablet Take 1 mg by mouth 3 (three) times daily.     Historical Provider, MD  ALPRAZolam Prudy Feeler(XANAX) 1 MG tablet Take 1 mg by mouth 4 (four) times daily.    Historical Provider, MD  ALPRAZolam Prudy Feeler(XANAX) 1 MG tablet Take 1 mg by mouth 4 (four) times daily.    Historical Provider, MD  cyclobenzaprine (FLEXERIL) 5 MG tablet Take 1 tablet (5 mg total) by mouth 3 (three) times daily as needed for muscle spasms. 03/17/14   Arman FilterGail K Schulz, NP  divalproex (DEPAKOTE) 500 MG DR tablet Take 500 mg by mouth every morning.     Historical Provider, MD  divalproex (DEPAKOTE) 500 MG DR tablet Take 500 mg by mouth 2 (two) times daily.    Historical Provider, MD  ibuprofen (ADVIL,MOTRIN) 200 MG tablet Take 800 mg by mouth every 6 (six) hours as needed for moderate pain.    Historical Provider, MD  levothyroxine (SYNTHROID, LEVOTHROID) 100 MCG tablet Take 100 mcg by mouth daily.      Historical Provider, MD  levothyroxine (SYNTHROID, LEVOTHROID)  100 MCG tablet Take 100 mcg by mouth every morning.    Historical Provider, MD  levothyroxine (SYNTHROID, LEVOTHROID) 100 MCG tablet Take 100 mcg by mouth daily before breakfast.    Historical Provider, MD  vitamin B-12 (CYANOCOBALAMIN) 1000 MCG tablet Take 1,000 mcg by mouth daily.    Historical Provider, MD   BP 130/74 mmHg  Pulse 113  Temp(Src) 98.9 F (37.2 C) (Oral)  Resp 18  Ht 5\' 9"  (1.753 m)  Wt 260 lb (117.935 kg)  BMI 38.38 kg/m2  SpO2 98% Physical Exam  Constitutional: He is oriented to  person, place, and time. He appears well-developed and well-nourished.  Non-toxic appearance.  HENT:  Head: Normocephalic.  Right Ear: Tympanic membrane and external ear normal.  Left Ear: Tympanic membrane and external ear normal.  There is pain to palpation of the left upper first molar. There is swelling of the left upper gum, but no abscess appreciated. The airway is patent. There is no swelling under the tongue.  Eyes: EOM and lids are normal. Pupils are equal, round, and reactive to light.  Neck: Normal range of motion. Neck supple. Carotid bruit is not present.  Cardiovascular: Normal rate, regular rhythm, normal heart sounds, intact distal pulses and normal pulses.   Pulmonary/Chest: Breath sounds normal. No respiratory distress.  Abdominal: Soft. Bowel sounds are normal. There is no tenderness. There is no guarding.  Musculoskeletal: Normal range of motion.  Lymphadenopathy:       Head (right side): No submandibular adenopathy present.       Head (left side): No submandibular adenopathy present.    He has no cervical adenopathy.  Neurological: He is alert and oriented to person, place, and time. He has normal strength. No cranial nerve deficit or sensory deficit.  Skin: Skin is warm and dry.  Psychiatric: His speech is normal. His mood appears anxious.  Nursing note and vitals reviewed.   ED Course  Procedures (including critical care time) Labs Review Labs Reviewed - No data to display  Imaging Review No results found.   EKG Interpretation None      MDM  Patient has had problems with this tooth before. Is no visible abscess appreciated. No evidence for Ludwig's angina. patient speaks in complete sentences. Patient frequently inform me of how painful his tooth is.  Prescription for clindamycin and ibuprofen given to the patient. Patient treated in the emergency department with clindamycin ibuprofen and Tylenol. Patient strongly encouraged to see a dentist as soon as  possible.  At discharge the nurse reports the patient was very concerned about what he was receiving and wanted details about the medications, dosing, what each medication was 4. He became very upset and stated to the nurse that he had Tylenol and ibuprofen at home. He asked the nurse if I was out of my" fucking mind". He refused the medications here in the emergency department, and refused the discharge medications, and told the nurse to tell me to show them up my ass. Patient left the emergency department immediately.    Final diagnoses:  Toothache    **I have reviewed nursing notes, vital signs, and all appropriate lab and imaging results for this patient.Kathie Dike*    Laasia Arcos M Ermine Spofford, PA-C 06/06/14 2039  Ward GivensIva L Knapp, MD 06/06/14 [phone]

## 2014-06-06 NOTE — ED Notes (Signed)
Pt. Stating "I already took ibuprofen and tylenol at home, is that all he is going to give me? Is he crazy" Pt. Cursing and requesting to see Tylenol tablets, pt. Examined the tablets and continued to curse. Pt. Refusing Tylenol and ibuprofen.Pt. Refusing antibiotics. Pt. Asking about discharge prescriptions, explained to pt. Discharge rx and instructions.Continuing to curse and states "I don't want those, you can tell the Dr. Dorothea Ogleo shove it up his ass" Pt. Agitated. Pt. Refusing to sign for discharge. Pt. Left without taking discharge instructions.

## 2014-06-06 NOTE — ED Notes (Signed)
Dental pain , onset today.

## 2014-06-22 ENCOUNTER — Encounter (HOSPITAL_COMMUNITY): Payer: Self-pay | Admitting: Emergency Medicine

## 2014-06-22 ENCOUNTER — Emergency Department (HOSPITAL_COMMUNITY)
Admission: EM | Admit: 2014-06-22 | Discharge: 2014-06-22 | Discharge status: Against Medical Advice | Payer: Medicaid Other | Attending: Emergency Medicine | Admitting: Emergency Medicine

## 2014-06-22 DIAGNOSIS — M25511 Pain in right shoulder: Secondary | ICD-10-CM

## 2014-06-22 DIAGNOSIS — Y9389 Activity, other specified: Secondary | ICD-10-CM | POA: Insufficient documentation

## 2014-06-22 DIAGNOSIS — S4991XA Unspecified injury of right shoulder and upper arm, initial encounter: Secondary | ICD-10-CM | POA: Diagnosis not present

## 2014-06-22 DIAGNOSIS — Y998 Other external cause status: Secondary | ICD-10-CM | POA: Insufficient documentation

## 2014-06-22 DIAGNOSIS — E039 Hypothyroidism, unspecified: Secondary | ICD-10-CM | POA: Diagnosis not present

## 2014-06-22 DIAGNOSIS — F419 Anxiety disorder, unspecified: Secondary | ICD-10-CM | POA: Insufficient documentation

## 2014-06-22 DIAGNOSIS — G8929 Other chronic pain: Secondary | ICD-10-CM | POA: Diagnosis not present

## 2014-06-22 DIAGNOSIS — G40909 Epilepsy, unspecified, not intractable, without status epilepticus: Secondary | ICD-10-CM | POA: Diagnosis not present

## 2014-06-22 DIAGNOSIS — Z720 Tobacco use: Secondary | ICD-10-CM | POA: Diagnosis not present

## 2014-06-22 DIAGNOSIS — Y9289 Other specified places as the place of occurrence of the external cause: Secondary | ICD-10-CM | POA: Insufficient documentation

## 2014-06-22 DIAGNOSIS — Z792 Long term (current) use of antibiotics: Secondary | ICD-10-CM | POA: Insufficient documentation

## 2014-06-22 DIAGNOSIS — Z79899 Other long term (current) drug therapy: Secondary | ICD-10-CM | POA: Diagnosis not present

## 2014-06-22 DIAGNOSIS — W109XXA Fall (on) (from) unspecified stairs and steps, initial encounter: Secondary | ICD-10-CM | POA: Diagnosis not present

## 2014-06-22 MED ORDER — ETOMIDATE 2 MG/ML IV SOLN
0.2000 mg/kg | Freq: Once | INTRAVENOUS | Status: DC
  Filled 2014-06-22: qty 20

## 2014-06-22 NOTE — ED Notes (Signed)
Notified by Registration that pt walked out, pt did not notify nursing before leaving. Pt in NAD. Per MD pt became upset when MD told him that he would not give him pain medication but would sedate him instead.

## 2014-06-22 NOTE — ED Provider Notes (Signed)
CSN: [id number]     Arrival date & time 06/22/14  1805 History   First MD Initiated Contact with Patient 06/22/14 1816     Chief Complaint  Patient presents with  . Dislocation      The history is provided by the patient.   Patient has a history of recurrent right shoulder injuries.  He states that he fell down stairs tonight and injured his right shoulder.  Patient also has a history of opioid dependence.  He denies pain with range of motion of his right wrist or right elbow.  He reports pain with range of motion of his right shoulder.  His pain is mild to moderate in severity at this time.  Denies head injury.   Past Medical History  Diagnosis Date  . Anxiety disorder   . Chronic pain   . Pseudoseizure   . Polysubstance abuse   . Hypothyroidism   . Chronic back pain   . Chronic shoulder pain   . Thyroid disease   . Seizure   . Epilepsy   . Drug-seeking behavior   . Malingering    History reviewed. No pertinent past surgical history. No family history on file. History  Substance Use Topics  . Smoking status: Current Every Day Smoker -- 1.00 packs/day    Types: Cigarettes  . Smokeless tobacco: Not on file  . Alcohol Use: No    Review of Systems  All other systems reviewed and are negative.     Allergies  Chlorpromazine hcl; Haloperidol lactate; Ketorolac tromethamine; Haldol; Thorazine; Toradol; Tramadol; Haldol; and Toradol  Home Medications   Prior to Admission medications   Medication Sig Start Date End Date Taking? Authorizing Provider  ALPRAZolam Prudy Feeler(XANAX) 1 MG tablet Take 1 mg by mouth 3 (three) times daily.     Historical Provider, MD  ALPRAZolam Prudy Feeler(XANAX) 1 MG tablet Take 1 mg by mouth 4 (four) times daily.    Historical Provider, MD  ALPRAZolam Prudy Feeler(XANAX) 1 MG tablet Take 1 mg by mouth 4 (four) times daily.    Historical Provider, MD  clindamycin (CLEOCIN) 300 MG capsule 1 po tid with food 06/06/14   Kathie DikeHobson M Bryant, PA-C  cyclobenzaprine (FLEXERIL) 5 MG  tablet Take 1 tablet (5 mg total) by mouth 3 (three) times daily as needed for muscle spasms. 03/17/14   Arman FilterGail K Schulz, NP  divalproex (DEPAKOTE) 500 MG DR tablet Take 500 mg by mouth every morning.     Historical Provider, MD  divalproex (DEPAKOTE) 500 MG DR tablet Take 500 mg by mouth 2 (two) times daily.    Historical Provider, MD  ibuprofen (ADVIL,MOTRIN) 600 MG tablet Take 1 tablet (600 mg total) by mouth every 6 (six) hours as needed. 06/06/14   Kathie DikeHobson M Bryant, PA-C  levothyroxine (SYNTHROID, LEVOTHROID) 100 MCG tablet Take 100 mcg by mouth daily.      Historical Provider, MD  levothyroxine (SYNTHROID, LEVOTHROID) 100 MCG tablet Take 100 mcg by mouth every morning.    Historical Provider, MD  levothyroxine (SYNTHROID, LEVOTHROID) 100 MCG tablet Take 100 mcg by mouth daily before breakfast.    Historical Provider, MD  vitamin B-12 (CYANOCOBALAMIN) 1000 MCG tablet Take 1,000 mcg by mouth daily.    Historical Provider, MD   BP 135/79 mmHg  Pulse 117  Temp(Src) 98.7 F (37.1 C) (Oral)  Resp 24  Ht 5\' 9"  (1.753 m)  Wt 260 lb (117.935 kg)  BMI 38.38 kg/m2  SpO2 98% Physical Exam  Constitutional: He is oriented to  person, place, and time. He appears well-developed and well-nourished.  HENT:  Head: Normocephalic and atraumatic.  Eyes: EOM are normal.  Neck: Normal range of motion.  No C-spine tenderness.  Cardiovascular: Normal rate, regular rhythm, normal heart sounds and intact distal pulses.   Pulmonary/Chest: Effort normal and breath sounds normal. No respiratory distress.  Abdominal: Soft. He exhibits no distension. There is no tenderness.  Musculoskeletal:  Normal range of motion of right wrist and right elbow.  Patient with resistance to range of motion of his right shoulder.  Normal right radial pulse.  Normal grip strength in right hand  Neurological: He is alert and oriented to person, place, and time.  Skin: Skin is warm and dry.  Psychiatric: He has a normal mood and affect.  Judgment normal.  Nursing note and vitals reviewed.   ED Course  Procedures (including critical care time) Labs Review Labs Reviewed - No data to display  Imaging Review No results found.   EKG Interpretation None      MDM   Final diagnoses:  Right shoulder pain    I offered the patient x-rays of his right shoulder to evaluate for abnormality but told the patient that I would not bili give him anything narcotic for pain unless a injury was proven.  He became upset and walked out of the emergency department.  I offered to treat his pain with nonnarcotic medications and he refused.  The patient has a long-standing history of malingering and narcotic abuse.  Drug-seeking behavior is very common for this patient.  Review of his records demonstrate his last 9 shoulder x-rays are without fracture or dislocation.  He's had multiple images of his right shoulder, right scapula including CT imaging which demonstrates no defined abnormalities.  I suspect the patient's presentation today was again malingering however I felt as though imaging was necessary but yet the patient refused.  He understands to return the emergency department if he would like imaging    Lyanne CoKevin M Zaida Reiland, MD 06/22/14 [phone]

## 2014-06-22 NOTE — ED Notes (Signed)
PT states he fell down 2 stairs this evening and landed on his right arm. PT has decreased ROM and abnormality noted to right shoulder.

## 2014-08-19 ENCOUNTER — Encounter (HOSPITAL_COMMUNITY): Payer: Self-pay | Admitting: Emergency Medicine

## 2015-01-31 ENCOUNTER — Encounter (HOSPITAL_COMMUNITY): Payer: Self-pay

## 2015-01-31 ENCOUNTER — Emergency Department (HOSPITAL_COMMUNITY)
Admission: EM | Admit: 2015-01-31 | Discharge: 2015-01-31 | Disposition: A | Discharge status: Home / Self Care | Attending: Emergency Medicine | Admitting: Emergency Medicine

## 2015-01-31 ENCOUNTER — Emergency Department (HOSPITAL_COMMUNITY)

## 2015-01-31 DIAGNOSIS — Y9289 Other specified places as the place of occurrence of the external cause: Secondary | ICD-10-CM | POA: Insufficient documentation

## 2015-01-31 DIAGNOSIS — W01198A Fall on same level from slipping, tripping and stumbling with subsequent striking against other object, initial encounter: Secondary | ICD-10-CM | POA: Insufficient documentation

## 2015-01-31 DIAGNOSIS — Y9389 Activity, other specified: Secondary | ICD-10-CM | POA: Insufficient documentation

## 2015-01-31 DIAGNOSIS — Z792 Long term (current) use of antibiotics: Secondary | ICD-10-CM | POA: Diagnosis not present

## 2015-01-31 DIAGNOSIS — Z720 Tobacco use: Secondary | ICD-10-CM | POA: Insufficient documentation

## 2015-01-31 DIAGNOSIS — G40909 Epilepsy, unspecified, not intractable, without status epilepticus: Secondary | ICD-10-CM | POA: Diagnosis not present

## 2015-01-31 DIAGNOSIS — S8991XA Unspecified injury of right lower leg, initial encounter: Secondary | ICD-10-CM | POA: Insufficient documentation

## 2015-01-31 DIAGNOSIS — Y998 Other external cause status: Secondary | ICD-10-CM | POA: Diagnosis not present

## 2015-01-31 DIAGNOSIS — S199XXA Unspecified injury of neck, initial encounter: Secondary | ICD-10-CM | POA: Diagnosis not present

## 2015-01-31 DIAGNOSIS — W19XXXA Unspecified fall, initial encounter: Secondary | ICD-10-CM

## 2015-01-31 DIAGNOSIS — M5489 Other dorsalgia: Secondary | ICD-10-CM

## 2015-01-31 DIAGNOSIS — G8929 Other chronic pain: Secondary | ICD-10-CM | POA: Diagnosis not present

## 2015-01-31 DIAGNOSIS — S29002A Unspecified injury of muscle and tendon of back wall of thorax, initial encounter: Secondary | ICD-10-CM | POA: Insufficient documentation

## 2015-01-31 DIAGNOSIS — F419 Anxiety disorder, unspecified: Secondary | ICD-10-CM | POA: Diagnosis not present

## 2015-01-31 DIAGNOSIS — Z79899 Other long term (current) drug therapy: Secondary | ICD-10-CM | POA: Insufficient documentation

## 2015-01-31 DIAGNOSIS — S3992XA Unspecified injury of lower back, initial encounter: Secondary | ICD-10-CM | POA: Insufficient documentation

## 2015-01-31 DIAGNOSIS — E039 Hypothyroidism, unspecified: Secondary | ICD-10-CM | POA: Insufficient documentation

## 2015-01-31 MED ORDER — ACETAMINOPHEN 500 MG PO CAPS: 1000.0000 mg | ORAL_CAPSULE | Freq: Three times a day (TID) | ORAL | Status: DC | PRN

## 2015-01-31 MED ORDER — ACETAMINOPHEN 325 MG PO TABS
650.0000 mg | ORAL_TABLET | Freq: Once | ORAL | Status: AC
  Administered 2015-01-31: 650 mg via ORAL
  Filled 2015-01-31: qty 2

## 2015-01-31 NOTE — Discharge Instructions (Signed)
Back Pain, Adult Low back pain is very common. About 1 in 5 people have back pain.The cause of low back pain is rarely dangerous. The pain often gets better over time.About half of people with a sudden onset of back pain feel better in just 2 weeks. About 8 in 10 people feel better by 6 weeks.  CAUSES Some common causes of back pain include:  Strain of the muscles or ligaments supporting the spine.  Wear and tear (degeneration) of the spinal discs.  Arthritis.  Direct injury to the back. DIAGNOSIS Most of the time, the direct cause of low back pain is not known.However, back pain can be treated effectively even when the exact cause of the pain is unknown.Answering your caregiver's questions about your overall health and symptoms is one of the most accurate ways to make sure the cause of your pain is not dangerous. If your caregiver needs more information, he or she may order lab work or imaging tests (X-rays or MRIs).However, even if imaging tests show changes in your back, this usually does not require surgery. HOME CARE INSTRUCTIONS For many people, back pain returns.Since low back pain is rarely dangerous, it is often a condition that people can learn to manageon their own.   Remain active. It is stressful on the back to sit or stand in one place. Do not sit, drive, or stand in one place for more than 30 minutes at a time. Take short walks on level surfaces as soon as pain allows.Try to increase the length of time you walk each day.  Do not stay in bed.Resting more than 1 or 2 days can delay your recovery.  Do not avoid exercise or work.Your body is made to move.It is not dangerous to be active, even though your back may hurt.Your back will likely heal faster if you return to being active before your pain is gone.  Pay attention to your body when you bend and lift. Many people have less discomfortwhen lifting if they bend their knees, keep the load close to their bodies,and  avoid twisting. Often, the most comfortable positions are those that put less stress on your recovering back.  Find a comfortable position to sleep. Use a firm mattress and lie on your side with your knees slightly bent. If you lie on your back, put a pillow under your knees.  Only take over-the-counter or prescription medicines as directed by your caregiver. Over-the-counter medicines to reduce pain and inflammation are often the most helpful.Your caregiver may prescribe muscle relaxant drugs.These medicines help dull your pain so you can more quickly return to your normal activities and healthy exercise.  Put ice on the injured area.  Put ice in a plastic bag.  Place a towel between your skin and the bag.  Leave the ice on for 15-20 minutes, 03-04 times a day for the first 2 to 3 days. After that, ice and heat may be alternated to reduce pain and spasms.  Ask your caregiver about trying back exercises and gentle massage. This may be of some benefit.  Avoid feeling anxious or stressed.Stress increases muscle tension and can worsen back pain.It is important to recognize when you are anxious or stressed and learn ways to manage it.Exercise is a great option. SEEK MEDICAL CARE IF:  You have pain that is not relieved with rest or medicine.  You have pain that does not improve in 1 week.  You have new symptoms.  You are generally not feeling well. SEEK   IMMEDIATE MEDICAL CARE IF:   You have pain that radiates from your back into your legs.  You develop new bowel or bladder control problems.  You have unusual weakness or numbness in your arms or legs.  You develop nausea or vomiting.  You develop abdominal pain.  You feel faint. Document Released: 06/28/2005 Document Revised: 12/28/2011 Document Reviewed: 10/30/2013 [employer] Patient Information 2015 [employer]. This information is not intended to replace advice given to you by your health care provider. Make sure you  discuss any questions you have with your health care provider.  

## 2015-01-31 NOTE — ED Notes (Signed)
Pt walked out in blue paper scrubs with a Neurosurgeon and taken back to [facility].

## 2015-01-31 NOTE — ED Notes (Signed)
Pt is inmate at [facility].  Pt reportedly fell while in the shower, landed on his buttocks, c/o pain to lower back and down right leg.  Pt states he cannot move his right leg at all and states he does not have feeling in his right lower leg

## 2015-01-31 NOTE — ED Notes (Signed)
Patient requesting pain medication. Informed him that MD needs CT results prior to ordering medication.

## 2015-01-31 NOTE — ED Provider Notes (Signed)
CSN: [id number]     Arrival date & time 01/31/15  2104 History  This chart was scribed for Tilden Fossa, MD by Tanda Rockers, ED Scribe. This patient was seen in room APA16A/APA16A and the patient's care was started at 9:20 PM.  Chief Complaint  Patient presents with  . Fall   The history is provided by the patient. No language interpreter was used.     HPI Comments: [name] is a 39 y.o. male brought in from [facility], who presents to the Emergency Department complaining of sudden onset, mid and lower back pain radiating down right leg s/p ground level fall that occurred earlier tonight. Pt states he was dressing himself after getting out of the shower when he fell and landed onto his back, causing the pain. Pt states he also hit his head but denies LOC. Pt notes that immediately after the fall he was having numbness to his right leg but states he is now having a burning sensation to the leg. He states he is unable to move his right leg as well. He denies previous injury to the back. Denies any other associated symptoms.   Past Medical History  Diagnosis Date  . Anxiety disorder   . Chronic pain   . Pseudoseizure   . Polysubstance abuse   . Hypothyroidism   . Chronic back pain   . Chronic shoulder pain   . Thyroid disease   . Seizure   . Epilepsy   . Drug-seeking behavior   . Malingering    History reviewed. No pertinent past surgical history. No family history on file. History  Substance Use Topics  . Smoking status: Current Every Day Smoker -- 1.00 packs/day    Types: Cigarettes  . Smokeless tobacco: Not on file  . Alcohol Use: No    Review of Systems  Musculoskeletal: Positive for back pain and arthralgias (Right leg).  Neurological: Positive for weakness and numbness. Negative for syncope.  All other systems reviewed and are negative.     Allergies  Chlorpromazine hcl; Haloperidol and related; Haloperidol lactate; Ketorolac tromethamine;  Haldol; Thorazine; Toradol; Toradol; Tramadol; Haldol; and Toradol  Home Medications   Prior to Admission medications   Medication Sig Start Date End Date Taking? Authorizing Provider  ALPRAZolam Prudy Feeler) 1 MG tablet Take 1 mg by mouth 3 (three) times daily.     Historical Provider, MD  ALPRAZolam Prudy Feeler) 1 MG tablet Take 1 mg by mouth 4 (four) times daily.    Historical Provider, MD  ALPRAZolam Prudy Feeler) 1 MG tablet Take 1 mg by mouth 4 (four) times daily.    Historical Provider, MD  clindamycin (CLEOCIN) 300 MG capsule 1 po tid with food 06/06/14   Ivery Quale, PA-C  cyclobenzaprine (FLEXERIL) 5 MG tablet Take 1 tablet (5 mg total) by mouth 3 (three) times daily as needed for muscle spasms. 03/17/14   Earley Favor, NP  divalproex (DEPAKOTE) 500 MG DR tablet Take 500 mg by mouth every morning.     Historical Provider, MD  divalproex (DEPAKOTE) 500 MG DR tablet Take 500 mg by mouth 2 (two) times daily.    Historical Provider, MD  HYDROcodone-acetaminophen (NORCO/VICODIN) 5-325 MG per tablet Take 1 tablet by mouth every 6 (six) hours as needed for moderate pain.    Historical Provider, MD  ibuprofen (ADVIL,MOTRIN) 600 MG tablet Take 1 tablet (600 mg total) by mouth every 6 (six) hours as needed. 06/06/14   Ivery Quale, PA-C  ibuprofen (ADVIL,MOTRIN) 600 MG  tablet Take 600 mg by mouth every 6 (six) hours as needed for mild pain or moderate pain.    Historical Provider, MD  levothyroxine (SYNTHROID, LEVOTHROID) 100 MCG tablet Take 100 mcg by mouth daily.      Historical Provider, MD  levothyroxine (SYNTHROID, LEVOTHROID) 100 MCG tablet Take 100 mcg by mouth every morning.    Historical Provider, MD  levothyroxine (SYNTHROID, LEVOTHROID) 100 MCG tablet Take 100 mcg by mouth daily before breakfast.    Historical Provider, MD  vitamin B-12 (CYANOCOBALAMIN) 1000 MCG tablet Take 1,000 mcg by mouth daily.    Historical Provider, MD   Triage Vitals: BP 114/77 mmHg  Pulse 75  Temp(Src) 98.6 F (37 C)  (Oral)  Resp 24  Ht 5\' 9"  (1.753 m)  Wt 275 lb (124.739 kg)  BMI 40.59 kg/m2  SpO2 100%   Physical Exam  Constitutional: He is oriented to person, place, and time. He appears well-developed and well-nourished.  HENT:  Head: Normocephalic and atraumatic.  Cardiovascular: Normal rate and regular rhythm.   No murmur heard. Pulmonary/Chest: Effort normal and breath sounds normal. No respiratory distress.  Abdominal: Soft. There is no tenderness. There is no rebound and no guarding.  Genitourinary:  Normal rectal tone.  Musculoskeletal: He exhibits no edema.  Mild cervical tenderness to palpation. Significant diffuse thoracic and lumbar tenderness to palpation. There is no discrete bony tenderness or step-offs. 2+ DP and femoral pulses bilaterally  Neurological: He is alert and oriented to person, place, and time.  Patient does not move the right lower extremity other than tensing his right thigh. Absent sensation from the knee to the foot and the right lower extremity. 2+ patellar reflexes bilaterally. No saddle anesthesia.  Skin: Skin is warm and dry.  Psychiatric: He has a normal mood and affect. His behavior is normal.  Nursing note and vitals reviewed.   ED Course  Procedures (including critical care time)  DIAGNOSTIC STUDIES: Oxygen Saturation is 100% on RA, normal by my interpretation.    COORDINATION OF CARE: 9:27 PM-Discussed treatment plan with pt at bedside and pt agreed to plan.   Labs Review Labs Reviewed - No data to display  Imaging Review Ct Cervical Spine Wo Contrast  01/31/2015   CLINICAL DATA:  Initial valuation for acute trauma, fall, diffuse spine pain. Radiation down right leg.  EXAM: CT CERVICAL, THORACIC, AND LUMBAR SPINE WITHOUT CONTRAST  TECHNIQUE: Multidetector CT imaging of the cervical, thoracic and lumbar spine was performed without intravenous contrast. Multiplanar CT image reconstructions were also generated.  COMPARISON:  None.  FINDINGS: CT  CERVICAL SPINE FINDINGS  The vertebral bodies are normally aligned with preservation of the normal cervical lordosis. Vertebral body heights are preserved. Normal C1-2 articulations are intact. No prevertebral soft tissue swelling. No acute fracture or listhesis.  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  Visualized soft tissues of the neck are within normal limits. Visualized lung apices are clear without evidence of apical pneumothorax.  CT THORACIC SPINE FINDINGS  Vertebral bodies are normally aligned with preservation of the normal thoracic kyphosis. Vertebral body heights are preserved. No acute fracture listhesis.  Scattered small chronic degenerative endplate Schmorl's nodes seen within the mid and lower thoracic spine. No significant degenerative disc disease appreciated. Facet arthrosis present bilaterally at T8-9 and T11-12. No significant canal or foraminal stenosis.  Visualized paraspinous soft tissues are within normal limits. Visualized lungs grossly clear. Cholecystectomy clips noted.  CT LUMBAR SPINE FINDINGS  Vertebral bodies are normally aligned with preservation of the normal lumbar lordosis. Vertebral body heights maintained. No acute fracture listhesis.  Visualized paraspinous soft tissues within normal limits.  Degenerative disc bulging present at L4-5. Present Degenerative endplate spurring seen anteriorly at the superior endplates of L4 and L5. Mild bilateral facet arthrosis present at L4-5, slightly worse on the right. Posterior disc protrusion present at L5-S1 with associated discal calcification. No significant canal or foraminal stenosis identified.  IMPRESSION: CT CERVICAL SPINE:  No acute traumatic injury within the cervical spine.  CT THORACIC SPINE:  No acute traumatic injury within the thoracic spine.  CT LUMBAR SPINE:  1. No acute traumatic injury within the lumbar spine. 2. Posterior disc protrusion at L5-S1 without significant stenosis.  3. Mild degenerative disc bulging at L4-5 without significant stenosis.   Electronically Signed   By: Rise Mu M.D.   On: 01/31/2015 22:57   Ct Thoracic Spine Wo Contrast  01/31/2015   CLINICAL DATA:  Initial valuation for acute trauma, fall, diffuse spine pain. Radiation down right leg.  EXAM: CT CERVICAL, THORACIC, AND LUMBAR SPINE WITHOUT CONTRAST  TECHNIQUE: Multidetector CT imaging of the cervical, thoracic and lumbar spine was performed without intravenous contrast. Multiplanar CT image reconstructions were also generated.  COMPARISON:  None.  FINDINGS: CT CERVICAL SPINE FINDINGS  The vertebral bodies are normally aligned with preservation of the normal cervical lordosis. Vertebral body heights are preserved. Normal C1-2 articulations are intact. No prevertebral soft tissue swelling. No acute fracture or listhesis.  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  Visualized soft tissues of the neck are within normal limits. Visualized lung apices are clear without evidence of apical pneumothorax.  CT THORACIC SPINE FINDINGS  Vertebral bodies are normally aligned with preservation of the normal thoracic kyphosis. Vertebral body heights are preserved. No acute fracture listhesis.  Scattered small chronic degenerative endplate Schmorl's nodes seen within the mid and lower thoracic spine. No significant degenerative disc disease appreciated. Facet arthrosis present bilaterally at T8-9 and T11-12. No significant canal or foraminal stenosis.  Visualized paraspinous soft tissues are within normal limits. Visualized lungs grossly clear. Cholecystectomy clips noted.  CT LUMBAR SPINE FINDINGS  Vertebral bodies are normally aligned with preservation of the normal lumbar lordosis. Vertebral body heights maintained. No acute fracture listhesis.  Visualized paraspinous soft tissues within normal limits.  Degenerative disc bulging present at L4-5. Present Degenerative  endplate spurring seen anteriorly at the superior endplates of L4 and L5. Mild bilateral facet arthrosis present at L4-5, slightly worse on the right. Posterior disc protrusion present at L5-S1 with associated discal calcification. No significant canal or foraminal stenosis identified.  IMPRESSION: CT CERVICAL SPINE:  No acute traumatic injury within the cervical spine.  CT THORACIC SPINE:  No acute traumatic injury within the thoracic spine.  CT LUMBAR SPINE:  1. No acute traumatic injury within the lumbar spine. 2. Posterior disc protrusion at L5-S1 without significant stenosis. 3. Mild degenerative disc bulging at L4-5 without significant stenosis.   Electronically Signed   By: Rise Mu M.D.   On: 01/31/2015 22:57   Ct Lumbar Spine Wo Contrast  01/31/2015   CLINICAL DATA:  Initial valuation for acute trauma, fall, diffuse spine pain. Radiation down right leg.  EXAM: CT CERVICAL, THORACIC, AND LUMBAR SPINE WITHOUT CONTRAST  TECHNIQUE: Multidetector CT imaging of the cervical, thoracic and lumbar spine was performed without intravenous contrast. Multiplanar CT image reconstructions were also generated.  COMPARISON:  None.  FINDINGS: CT CERVICAL SPINE FINDINGS  The vertebral bodies are normally aligned with preservation of the normal cervical lordosis. Vertebral body heights are preserved. Normal C1-2 articulations are intact. No prevertebral soft tissue swelling. No acute fracture or listhesis.  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  Visualized soft tissues of the neck are within normal limits. Visualized lung apices are clear without evidence of apical pneumothorax.  CT THORACIC SPINE FINDINGS  Vertebral bodies are normally aligned with preservation of the normal thoracic kyphosis. Vertebral body heights are preserved. No acute fracture listhesis.  Scattered small chronic degenerative endplate Schmorl's nodes seen within the mid and lower thoracic  spine. No significant degenerative disc disease appreciated. Facet arthrosis present bilaterally at T8-9 and T11-12. No significant canal or foraminal stenosis.  Visualized paraspinous soft tissues are within normal limits. Visualized lungs grossly clear. Cholecystectomy clips noted.  CT LUMBAR SPINE FINDINGS  Vertebral bodies are normally aligned with preservation of the normal lumbar lordosis. Vertebral body heights maintained. No acute fracture listhesis.  Visualized paraspinous soft tissues within normal limits.  Degenerative disc bulging present at L4-5. Present Degenerative endplate spurring seen anteriorly at the superior endplates of L4 and L5. Mild bilateral facet arthrosis present at L4-5, slightly worse on the right. Posterior disc protrusion present at L5-S1 with associated discal calcification. No significant canal or foraminal stenosis identified.  IMPRESSION: CT CERVICAL SPINE:  No acute traumatic injury within the cervical spine.  CT THORACIC SPINE:  No acute traumatic injury within the thoracic spine.  CT LUMBAR SPINE:  1. No acute traumatic injury within the lumbar spine. 2. Posterior disc protrusion at L5-S1 without significant stenosis. 3. Mild degenerative disc bulging at L4-5 without significant stenosis.   Electronically Signed   By: Rise Mu M.D.   On: 01/31/2015 22:57     EKG Interpretation None      MDM   Final diagnoses:  Fall, initial encounter  Midline back pain, unspecified location   Patient here for evaluation of back pain and right lower extremity weakness following a mechanical fall. Patient unable/unwilling to move the right leg from the knee down on initial evaluation in the emergency department. On repeat evaluation patient is able to ambulate in the department. Given patient's symptoms and diffuse tenderness, CT scan of the cervical through lumbar spine was obtained that shows no evidence of acute fracture or abnormality. On prior chart review patient  has a history of demanding pain medicine before imaging as well as malingering. Discussed with patient that narcotic pain medicines are not indicated at this time and will treat his pain with Tylenol with outpatient follow-up. There is no evidence of acute spinal cord injury based on his examination and presentation.  I personally performed the services described in this documentation, which was scribed in my presence. The recorded information has been reviewed and is accurate.      Tilden Fossa, MD 02/01/15 [phone]

## 2015-02-04 ENCOUNTER — Emergency Department (HOSPITAL_COMMUNITY)
Admission: EM | Admit: 2015-02-04 | Discharge: 2015-02-04 | Disposition: A | Discharge status: Home / Self Care | Attending: Emergency Medicine | Admitting: Emergency Medicine

## 2015-02-04 ENCOUNTER — Emergency Department (HOSPITAL_COMMUNITY)

## 2015-02-04 ENCOUNTER — Encounter (HOSPITAL_COMMUNITY): Payer: Self-pay | Admitting: *Deleted

## 2015-02-04 DIAGNOSIS — Y998 Other external cause status: Secondary | ICD-10-CM | POA: Diagnosis not present

## 2015-02-04 DIAGNOSIS — R569 Unspecified convulsions: Secondary | ICD-10-CM

## 2015-02-04 DIAGNOSIS — E039 Hypothyroidism, unspecified: Secondary | ICD-10-CM | POA: Diagnosis not present

## 2015-02-04 DIAGNOSIS — Y9289 Other specified places as the place of occurrence of the external cause: Secondary | ICD-10-CM | POA: Diagnosis not present

## 2015-02-04 DIAGNOSIS — T189XXA Foreign body of alimentary tract, part unspecified, initial encounter: Secondary | ICD-10-CM | POA: Insufficient documentation

## 2015-02-04 DIAGNOSIS — Z79899 Other long term (current) drug therapy: Secondary | ICD-10-CM | POA: Insufficient documentation

## 2015-02-04 DIAGNOSIS — F445 Conversion disorder with seizures or convulsions: Secondary | ICD-10-CM

## 2015-02-04 DIAGNOSIS — Y9389 Activity, other specified: Secondary | ICD-10-CM | POA: Insufficient documentation

## 2015-02-04 DIAGNOSIS — G8929 Other chronic pain: Secondary | ICD-10-CM | POA: Diagnosis not present

## 2015-02-04 DIAGNOSIS — G40909 Epilepsy, unspecified, not intractable, without status epilepticus: Secondary | ICD-10-CM | POA: Insufficient documentation

## 2015-02-04 DIAGNOSIS — Z720 Tobacco use: Secondary | ICD-10-CM | POA: Diagnosis not present

## 2015-02-04 DIAGNOSIS — X58XXXA Exposure to other specified factors, initial encounter: Secondary | ICD-10-CM | POA: Insufficient documentation

## 2015-02-04 DIAGNOSIS — F419 Anxiety disorder, unspecified: Secondary | ICD-10-CM | POA: Diagnosis not present

## 2015-02-04 HISTORY — DX: Personal history of other medical treatment: Z92.89

## 2015-02-04 MED ORDER — LORAZEPAM 2 MG/ML IJ SOLN
1.0000 mg | Freq: Once | INTRAMUSCULAR | Status: AC
  Administered 2015-02-04: 1 mg via INTRAVENOUS

## 2015-02-04 MED ORDER — LEVETIRACETAM IN NACL 1000 MG/100ML IV SOLN
1000.0000 mg | Freq: Once | INTRAVENOUS | Status: AC
  Administered 2015-02-04: 1000 mg via INTRAVENOUS
  Filled 2015-02-04: qty 100

## 2015-02-04 MED ORDER — SODIUM CHLORIDE 0.9 % IV SOLN
INTRAVENOUS | Status: DC
  Administered 2015-02-04: 21:00:00 via INTRAVENOUS

## 2015-02-04 MED ORDER — LORAZEPAM 2 MG/ML IJ SOLN
INTRAMUSCULAR | Status: AC
  Filled 2015-02-04: qty 1

## 2015-02-04 NOTE — ED Notes (Signed)
Pt refused to sign AMA. RN and police officer witnessed pt refusal. Pt to be taken out of ED when transportation arrives.

## 2015-02-04 NOTE — ED Notes (Signed)
Pt swallowed acardiac lead. Dr. Clarene Duke informed.

## 2015-02-04 NOTE — ED Notes (Signed)
Able to pull part of spork out of pt's mouth, pt states that he swallowed the whole spork and that there is still part of it down

## 2015-02-04 NOTE — ED Provider Notes (Signed)
CSN: [id number]     Arrival date & time 02/04/15  1931 History   First MD Initiated Contact with Patient 02/04/15 1939     Chief Complaint  Patient presents with  . Airway Obstruction      HPI Pt was seen at 1955.  Per Public relations account executive and pt: c/o sudden onset and resolution of one episode of "swallowed a FB" that occurred approximately 30-53min PTA. Reportedly pt intentionally chewed a plastic spork and swallowed it. Part of this was removed from [facility]'s [facility] at the jail P[facility]. Pt states he "feels like it's stuck," and he points to his lower chest. Denies abd pain, no N/V/D, no SOB, no wheezing/stridor, no dysphagia.    Past Medical History  Diagnosis Date  . Anxiety disorder   . Chronic pain   . Pseudoseizure   . Polysubstance abuse   . Hypothyroidism   . Chronic back pain   . Chronic shoulder pain   . Thyroid disease   . Seizure   . Epilepsy   . Drug-seeking behavior   . Malingering    History reviewed. No pertinent past surgical history.  History  Substance Use Topics  . Smoking status: Current Every Day Smoker -- 1.00 packs/day    Types: Cigarettes  . Smokeless tobacco: Not on file  . Alcohol Use: No    Review of Systems ROS: Statement: All systems negative except as marked or noted in the HPI; Constitutional: Negative for fever and chills. ; ; Eyes: Negative for eye pain, redness and discharge. ; ; ENMT: Negative for ear pain, hoarseness, nasal congestion, sinus pressure and sore throat. ; ; Cardiovascular: Negative for chest pain, palpitations, diaphoresis, dyspnea and peripheral edema. ; ; Respiratory: Negative for cough, wheezing and stridor. ; ; Gastrointestinal: +swallowed FB. Negative for nausea, vomiting, diarrhea, abdominal pain, blood in stool, hematemesis, jaundice and rectal bleeding. . ; ; Genitourinary: Negative for dysuria, flank pain and hematuria. ; ; Musculoskeletal: Negative for back pain and neck pain. Negative for swelling and trauma.; ; Skin: Negative  for pruritus, rash, abrasions, blisters, bruising and skin lesion.; ; Neuro: Negative for headache, lightheadedness and neck stiffness. Negative for weakness, altered level of consciousness , altered mental status, extremity weakness, paresthesias, involuntary movement, seizure and syncope.      Allergies  Chlorpromazine hcl; Haloperidol and related; Ketorolac tromethamine; Haldol; and Tramadol  Home Medications   Prior to Admission medications   Medication Sig Start Date End Date Taking? Authorizing Provider  divalproex (DEPAKOTE) 500 MG DR tablet Take 500 mg by mouth 3 (three) times daily.    Yes Historical Provider, MD  hydrOXYzine (ATARAX/VISTARIL) 50 MG tablet Take 50 mg by mouth 3 (three) times daily.   Yes Historical Provider, MD  levothyroxine (SYNTHROID, LEVOTHROID) 100 MCG tablet Take 100 mcg by mouth daily.     Yes Historical Provider, MD  Acetaminophen 500 MG coapsule Take 2 capsules (1,000 mg total) by mouth every 8 (eight) hours as needed for pain. 01/31/15   Tilden Fossa, MD   BP 112/80 mmHg  Pulse 78  Temp(Src) 98.4 F (36.9 C) (Oral)  Resp 20  Ht 5\' 9"  (1.753 m)  Wt 200 lb (90.719 kg)  BMI 29.52 kg/m2  SpO2 100% Physical Exam  2000: Physical examination:  Nursing notes reviewed; Vital signs and O2 SAT reviewed;  Constitutional: Well developed, Well nourished, Well hydrated, In no acute distress; Head:  Normocephalic, atraumatic; Eyes: EOMI, PERRL, No scleral icterus; ENMT: +dried blood inside left nares. Mouth and pharynx  normal, Mucous membranes moist Mouth and pharynx without lesions. No visible FB. No tonsillar exudates. No intra-oral edema. No submandibular or sublingual edema. No hoarse voice, no drooling, no stridor. No trismus. No soft tissue crepitus.; Neck: Supple, Full range of motion, No lymphadenopathy; Cardiovascular: Regular rate and rhythm, No gallop; Respiratory: Breath sounds clear & equal bilaterally, No wheezes. Speaking full sentences with ease, Normal  respiratory effort/excursion; Chest: Nontender, Movement normal; Abdomen: Soft, Nontender, Nondistended, Normal bowel sounds; Genitourinary: No CVA tenderness; Extremities: Pulses normal, No tenderness, No edema, No calf edema or asymmetry.; Neuro: AA&Ox3, Major CN grossly intact.  Speech clear. No gross focal motor or sensory deficits in extremities.; Skin: Color normal, Warm, Dry.   ED Course  Procedures   2005:  ED RN immediately pulled part of the spork (bowl with 2 tines attached) out of the pt's mouth on his arrival to the ED.   2040:  Pt returned from XR and I was called into the exam room due to pt having a seizure. Pt was laying stiff with arms and legs extended, head turned to the side, blinking his eyelids, body "shaking." Pt then stopped a short time afterwards, opened his eyes and looked at the ED RN, then closed his eyes again. Pt repeated this approximately 3 to 4 more times. CO at bedside removed pt's handcuffs while IV place and IV ativan given. Pt immediately stopped "shaking" when the handcuffs were removed, and then opened his eyes, and spoke with ED staff, A&O, immediately after the IV ativan was given. EPIC chart reviewed: pt noted to have negative EEG at [facility] in 2013 (dx pseudoseizures), and was seen at [facility] ED in 11/2014 for seizure "events not c/w seizure, but unable to r/o seizure disorder" and was given IV keppra and PO depakote. Multiple notes state pt wakes from his "seizure activity" and requests dilaudid by name, and when he doesn't receive it, then he becomes belligerent and aggressive with ED staff. Pt is now A&O after the IV ativan. No confusion/AMS. Pt informed he would not receive dilaudid. Pt responded clearly "I didn't ask for it anyway." Labs pending. IV keppra ordered.   2125:  Called into exam room again. Pt laying with his head turned to the side, "shaking" his arms and legs. Pt informed he would not receive any further ativan, any versed or any dilaudid (as per  EPIC chart review pt has receive IV ativan 6mg , IV versed 2mg  and IV dilaudid without cessation of his "seizure activity"). Pt then stopped his "shaking" and became A&O. Stating he "then didn't want any blood work done." Pt then informed he will leave AMA now, since he will not cooperate with ED staff regarding his plan of care. Pt then began to "shake" again, but keep his right arm extended with it not moving. These clearly do not appear to be actual seizures. XR without perforation. Pt remains A&O, VSS, resps easy, abd benign. Pt told to "stop shaking" by multiple ED staff. Pt then started to become increasingly agitated and belligerent with ED staff and Corrections Officer at bedside. CO states pt "does this stuff all the time at the jail" and has "long hx of malingering." CO states he will re-handcuff pt and bring him back to jail. Pt has climbed off the stretcher on his own without difficulty and has ambulated with steady gait, easy resps, NAD.       MDM  MDM Reviewed: previous chart, nursing note and vitals Interpretation: x-ray and labs  Dg Neck Soft Tissue 02/04/2015   CLINICAL DATA:  chewed up a spork and swallowed it. Stated he had burning in abd, tickle in throat, complaining because he stated the xray collimation light was making him feel dizzy- "like seizure was coming on". Pt in custody of sheriff deputy  EXAM: NECK SOFT TISSUES - 1+ VIEW  COMPARISON:  CT 01/31/2015  FINDINGS: There is no evidence of retropharyngeal soft tissue swelling or epiglottic enlargement. The cervical airway is unremarkable and no radio-opaque foreign body identified. Multiple dental restorations.  IMPRESSION: Negative.   Electronically Signed   By: Corlis Leak M.D.   On: 02/04/2015 20:53   Dg Chest 2 View 02/04/2015   CLINICAL DATA:  chewed up a spork and swallowed it. Stated he had burning in abd, tickle in throat, complaining because he stated the xray collimation light was making him feel dizzy- "like seizure  was coming on". Pt in custody of sheriff deputy  EXAM: CHEST - 2 VIEW  COMPARISON:  None available  FINDINGS: Lungs are clear. Heart size and mediastinal contours are within normal limits. No effusion. Visualized skeletal structures are unremarkable. No radiodense foreign body. Surgical clips right upper abdomen.  IMPRESSION: No acute cardiopulmonary disease.   Electronically Signed   By: Corlis Leak M.D.   On: 02/04/2015 20:52   Dg Abd 1 View 02/04/2015   CLINICAL DATA:  chewed up a spork and swallowed it. Stated he had burning in abd, tickle in throat, complaining because he stated the xray collimation light was making him feel dizzy- "like seizure was coming on". Pt in custody of sheriff deputy  EXAM: ABDOMEN - 1 VIEW  COMPARISON:  None.  FINDINGS: Normal bowel gas pattern.  No radiodense foreign body.  No abnormal abdominal calcifications.  Mild lumbar levoscoliosis.  IMPRESSION: Negative.   Electronically Signed   By: Corlis Leak M.D.   On: 02/04/2015 20:53      Samuel Jester, DO 02/07/15 [phone]

## 2015-02-04 NOTE — ED Notes (Signed)
Reported that pt is from jail and pt intentionally chewed a spork and swallowed it, reported that is lodged in esophagus

## 2015-02-04 NOTE — ED Notes (Signed)
Pt left in custody of officers.

## 2015-02-05 ENCOUNTER — Encounter (HOSPITAL_COMMUNITY): Payer: Self-pay

## 2015-02-05 ENCOUNTER — Emergency Department (HOSPITAL_COMMUNITY)

## 2015-02-05 ENCOUNTER — Emergency Department (HOSPITAL_COMMUNITY)
Admission: EM | Admit: 2015-02-05 | Discharge: 2015-02-05 | Disposition: A | Discharge status: Home / Self Care | Attending: Emergency Medicine | Admitting: Emergency Medicine

## 2015-02-05 DIAGNOSIS — Z720 Tobacco use: Secondary | ICD-10-CM | POA: Diagnosis not present

## 2015-02-05 DIAGNOSIS — G40909 Epilepsy, unspecified, not intractable, without status epilepticus: Secondary | ICD-10-CM | POA: Diagnosis not present

## 2015-02-05 DIAGNOSIS — G8929 Other chronic pain: Secondary | ICD-10-CM | POA: Diagnosis not present

## 2015-02-05 DIAGNOSIS — Z79899 Other long term (current) drug therapy: Secondary | ICD-10-CM | POA: Diagnosis not present

## 2015-02-05 DIAGNOSIS — E039 Hypothyroidism, unspecified: Secondary | ICD-10-CM | POA: Insufficient documentation

## 2015-02-05 DIAGNOSIS — F419 Anxiety disorder, unspecified: Secondary | ICD-10-CM | POA: Insufficient documentation

## 2015-02-05 DIAGNOSIS — N390 Urinary tract infection, site not specified: Secondary | ICD-10-CM | POA: Insufficient documentation

## 2015-02-05 DIAGNOSIS — R45851 Suicidal ideations: Secondary | ICD-10-CM | POA: Diagnosis present

## 2015-02-05 LAB — URINE MICROSCOPIC-ADD ON

## 2015-02-05 LAB — URINALYSIS, ROUTINE W REFLEX MICROSCOPIC
BILIRUBIN URINE: NEGATIVE
GLUCOSE, UA: NEGATIVE mg/dL
Ketones, ur: NEGATIVE mg/dL
NITRITE: NEGATIVE
PH: 7 (ref 5.0–8.0)
PROTEIN: NEGATIVE mg/dL
Specific Gravity, Urine: 1.02 (ref 1.005–1.030)
UROBILINOGEN UA: 0.2 mg/dL (ref 0.0–1.0)

## 2015-02-05 LAB — RAPID URINE DRUG SCREEN, HOSP PERFORMED
AMPHETAMINES: NOT DETECTED
Barbiturates: POSITIVE — AB
Benzodiazepines: POSITIVE — AB
Cocaine: NOT DETECTED
Opiates: NOT DETECTED
TETRAHYDROCANNABINOL: NOT DETECTED

## 2015-02-05 MED ORDER — CEFTRIAXONE SODIUM 1 G IJ SOLR
1.0000 g | Freq: Once | INTRAMUSCULAR | Status: AC
  Administered 2015-02-05: 1 g via INTRAMUSCULAR
  Filled 2015-02-05: qty 10

## 2015-02-05 MED ORDER — CEPHALEXIN 500 MG PO CAPS: 500.0000 mg | ORAL_CAPSULE | Freq: Four times a day (QID) | ORAL | Status: DC

## 2015-02-05 NOTE — Discharge Instructions (Signed)
Follow up with a medical provider after you finish your antibiotics
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
## 2015-02-05 NOTE — ED Provider Notes (Signed)
[id number]     Arrival date & time 02/05/15  1037 History  This chart was scribed for Bethann Berkshire, MD by Evon Slack, ED Scribe. This patient was seen in room APA10/APA10 and the patient's care was started at 10:49 AM.     Chief Complaint  Patient presents with  . V70.1   Patient is a 39 y.o. male presenting with mental health disorder. The history is provided by the patient. No language interpreter was used.  Mental Health Problem Presenting symptoms: self mutilation and suicidal thoughts   Presenting symptoms: no hallucinations   Patient accompanied by:  Law enforcement Onset quality:  Gradual Duration:  2 hours Progression:  Unchanged Chronicity:  Recurrent Relieved by:  Nothing Associated symptoms: no abdominal pain, no appetite change, no chest pain, no fatigue and no headaches    HPI Comments: [name] is a 39 y.o. male who presents to the Emergency Department complaining of resolved SI onset this morning. Pt states that he chewed up an arm band and removed 2 small pieces of metal. He states that he then placed the pieces of metal into his penis. He states that the pain is worse anytime he moves his penis. Pt states that this time he is no longer suicidal.    Past Medical History  Diagnosis Date  . Anxiety disorder   . Chronic pain   . Pseudoseizure   . Polysubstance abuse   . Hypothyroidism   . Chronic back pain   . Chronic shoulder pain   . Thyroid disease   . Seizure   . Epilepsy   . Drug-seeking behavior   . Malingering   . History of electroencephalogram 2013    [facility], "pseudoseizures"   History reviewed. No pertinent past surgical history. No family history on file. History  Substance Use Topics  . Smoking status: Current Every Day Smoker -- 1.00 packs/day    Types: Cigarettes  . Smokeless tobacco: Not on file  . Alcohol Use: Yes     Comment: prior to being incarcerated    Review of Systems  Constitutional: Negative for appetite  change and fatigue.  HENT: Negative for congestion, ear discharge and sinus pressure.   Eyes: Negative for discharge.  Respiratory: Negative for cough.   Cardiovascular: Negative for chest pain.  Gastrointestinal: Negative for abdominal pain and diarrhea.  Genitourinary: Positive for penile pain. Negative for frequency and hematuria.  Musculoskeletal: Negative for back pain.  Skin: Negative for rash.  Neurological: Negative for seizures and headaches.  Psychiatric/Behavioral: Positive for suicidal ideas and self-injury. Negative for hallucinations.  All other systems reviewed and are negative.     Allergies  Chlorpromazine hcl; Haloperidol and related; Ketorolac tromethamine; Haldol; and Tramadol  Home Medications   Prior to Admission medications   Medication Sig Start Date End Date Taking? Authorizing Provider  Acetaminophen 500 MG coapsule Take 2 capsules (1,000 mg total) by mouth every 8 (eight) hours as needed for pain. 01/31/15   Tilden Fossa, MD  divalproex (DEPAKOTE) 500 MG DR tablet Take 500 mg by mouth 3 (three) times daily.     Historical Provider, MD  hydrOXYzine (ATARAX/VISTARIL) 50 MG tablet Take 50 mg by mouth 2 (two) times daily.     Historical Provider, MD  levothyroxine (SYNTHROID, LEVOTHROID) 100 MCG tablet Take 100 mcg by mouth daily.      Historical Provider, MD  mirtazapine (REMERON) 30 MG tablet Take 30 mg by mouth at bedtime.    Historical Provider, MD  PHENobarbital (  LUMINAL) 64.8 MG tablet Take 64.8 mg by mouth 2 (two) times daily.    Historical Provider, MD   BP 108/62 mmHg  Pulse 78  Temp(Src) 98.1 F (36.7 C) (Oral)  Resp 18  Ht 5\' 9"  (1.753 m)  Wt 200 lb (90.719 kg)  BMI 29.52 kg/m2  SpO2 99%   Physical Exam  Constitutional: He is oriented to person, place, and time. He appears well-developed and well-nourished. No distress.  HENT:  Head: Normocephalic and atraumatic.  Eyes: Conjunctivae and EOM are normal.  Neck: Neck supple. No tracheal  deviation present.  Cardiovascular: Normal rate.   Pulmonary/Chest: Effort normal. No respiratory distress.  Genitourinary:  End of penis is tender.  Musculoskeletal: Normal range of motion.  Neurological: He is alert and oriented to person, place, and time.  Skin: Skin is warm and dry.  Psychiatric: He has a normal mood and affect. His behavior is normal.  Nursing note and vitals reviewed.   ED Course  Procedures (including critical care time) DIAGNOSTIC STUDIES: Oxygen Saturation is 99% on RA, normal by my interpretation.    COORDINATION OF CARE:    Labs Review Labs Reviewed - No data to display  Imaging Review Dg Neck Soft Tissue  02/04/2015   CLINICAL DATA:  chewed up a spork and swallowed it. Stated he had burning in abd, tickle in throat, complaining because he stated the xray collimation light was making him feel dizzy- "like seizure was coming on". Pt in custody of sheriff deputy  EXAM: NECK SOFT TISSUES - 1+ VIEW  COMPARISON:  CT 01/31/2015  FINDINGS: There is no evidence of retropharyngeal soft tissue swelling or epiglottic enlargement. The cervical airway is unremarkable and no radio-opaque foreign body identified. Multiple dental restorations.  IMPRESSION: Negative.   Electronically Signed   By: Corlis Leak M.D.   On: 02/04/2015 20:53   Dg Chest 2 View  02/04/2015   CLINICAL DATA:  chewed up a spork and swallowed it. Stated he had burning in abd, tickle in throat, complaining because he stated the xray collimation light was making him feel dizzy- "like seizure was coming on". Pt in custody of sheriff deputy  EXAM: CHEST - 2 VIEW  COMPARISON:  None available  FINDINGS: Lungs are clear. Heart size and mediastinal contours are within normal limits. No effusion. Visualized skeletal structures are unremarkable. No radiodense foreign body. Surgical clips right upper abdomen.  IMPRESSION: No acute cardiopulmonary disease.   Electronically Signed   By: Corlis Leak M.D.   On: 02/04/2015  20:52   Dg Abd 1 View  02/04/2015   CLINICAL DATA:  chewed up a spork and swallowed it. Stated he had burning in abd, tickle in throat, complaining because he stated the xray collimation light was making him feel dizzy- "like seizure was coming on". Pt in custody of sheriff deputy  EXAM: ABDOMEN - 1 VIEW  COMPARISON:  None.  FINDINGS: Normal bowel gas pattern.  No radiodense foreign body.  No abnormal abdominal calcifications.  Mild lumbar levoscoliosis.  IMPRESSION: Negative.   Electronically Signed   By: Corlis Leak M.D.   On: 02/04/2015 20:53     EKG Interpretation None      MDM   Final diagnoses:  None      Pt with uti and hx of foreign body in penis.  Pt states the foreign body came out of his penis prior to getting the ct.  He did not tell anyone.  tx uti with keflex and he  will follow up with provider after finishing antibiotics     Bethann Berkshire, MD 02/05/15 1504

## 2015-02-05 NOTE — ED Notes (Signed)
Pt refuses Abd xray, MD informed.

## 2015-02-05 NOTE — ED Notes (Signed)
Obtained a small amount of urine from pt.  Pt in CT now.

## 2015-02-05 NOTE — ED Notes (Signed)
Dr Estell Harpin informed pt is refusing labs and urine sample as ordered.  Lab Francisco Capuchin in to draw labs and pt refused.

## 2015-02-05 NOTE — ED Notes (Signed)
Went to inform pt we needed urine sample and is refusing to have labs drawn.  Sheriff x3 in room with pt.

## 2015-02-07 LAB — URINE CULTURE

## 2015-07-15 ENCOUNTER — Emergency Department (HOSPITAL_COMMUNITY)

## 2015-07-15 ENCOUNTER — Emergency Department (HOSPITAL_COMMUNITY)
Admission: EM | Admit: 2015-07-15 | Discharge: 2015-07-15 | Discharge status: Against Medical Advice | Attending: Emergency Medicine | Admitting: Emergency Medicine

## 2015-07-15 ENCOUNTER — Encounter (HOSPITAL_COMMUNITY): Payer: Self-pay | Admitting: Emergency Medicine

## 2015-07-15 DIAGNOSIS — Z8659 Personal history of other mental and behavioral disorders: Secondary | ICD-10-CM | POA: Insufficient documentation

## 2015-07-15 DIAGNOSIS — G8929 Other chronic pain: Secondary | ICD-10-CM | POA: Diagnosis not present

## 2015-07-15 DIAGNOSIS — E039 Hypothyroidism, unspecified: Secondary | ICD-10-CM | POA: Insufficient documentation

## 2015-07-15 DIAGNOSIS — Y9289 Other specified places as the place of occurrence of the external cause: Secondary | ICD-10-CM | POA: Diagnosis not present

## 2015-07-15 DIAGNOSIS — F17210 Nicotine dependence, cigarettes, uncomplicated: Secondary | ICD-10-CM | POA: Insufficient documentation

## 2015-07-15 DIAGNOSIS — Y998 Other external cause status: Secondary | ICD-10-CM | POA: Insufficient documentation

## 2015-07-15 DIAGNOSIS — Y9389 Activity, other specified: Secondary | ICD-10-CM | POA: Insufficient documentation

## 2015-07-15 DIAGNOSIS — W1839XA Other fall on same level, initial encounter: Secondary | ICD-10-CM | POA: Insufficient documentation

## 2015-07-15 DIAGNOSIS — S4991XA Unspecified injury of right shoulder and upper arm, initial encounter: Secondary | ICD-10-CM | POA: Diagnosis not present

## 2015-07-15 DIAGNOSIS — Z5329 Procedure and treatment not carried out because of patient's decision for other reasons: Secondary | ICD-10-CM

## 2015-07-15 DIAGNOSIS — G40909 Epilepsy, unspecified, not intractable, without status epilepticus: Secondary | ICD-10-CM | POA: Diagnosis not present

## 2015-07-15 DIAGNOSIS — M25511 Pain in right shoulder: Secondary | ICD-10-CM

## 2015-07-15 DIAGNOSIS — Z5320 Procedure and treatment not carried out because of patient's decision for unspecified reasons: Secondary | ICD-10-CM

## 2015-07-15 DIAGNOSIS — Z79899 Other long term (current) drug therapy: Secondary | ICD-10-CM | POA: Insufficient documentation

## 2015-07-15 MED ORDER — ACETAMINOPHEN 500 MG PO TABS: 500.0000 mg | ORAL_TABLET | Freq: Four times a day (QID) | ORAL | Status: AC | PRN

## 2015-07-15 NOTE — ED Notes (Signed)
MD at bedside. 

## 2015-07-15 NOTE — Discharge Instructions (Signed)

## 2015-07-15 NOTE — ED Notes (Signed)
Pt refuses ice to extremity

## 2015-07-15 NOTE — ED Provider Notes (Signed)
CSN: [id number]     Arrival date & time 07/15/15  1323 History   First MD Initiated Contact with Patient 07/15/15 1333     Chief Complaint  Patient presents with  . Shoulder Injury     (Consider location/radiation/quality/duration/timing/severity/associated sxs/prior Treatment) Patient is a 40 y.o. male presenting with shoulder pain. The history is provided by the patient.  Shoulder Pain Location:  Shoulder Injury: yes   Mechanism of injury: fall   Fall:    Height of fall:  Standing to floor with convulsive episode Shoulder location:  R shoulder Pain details:    Quality:  Aching   Severity:  Moderate   Onset quality:  Gradual   Timing:  Constant   Progression:  Unchanged Chronicity:  Recurrent Dislocation: no   Prior injury to area:  Yes Relieved by:  Nothing Worsened by:  Nothing tried Ineffective treatments:  None tried   Past Medical History  Diagnosis Date  . Anxiety disorder   . Chronic pain   . Pseudoseizure (HCC)   . Polysubstance abuse   . Hypothyroidism   . Chronic back pain   . Chronic shoulder pain   . Thyroid disease   . Seizure (HCC)   . Epilepsy (HCC)   . Drug-seeking behavior   . Malingering   . History of electroencephalogram 2013    [facility], "pseudoseizures"   History reviewed. No pertinent past surgical history. History reviewed. No pertinent family history. Social History  Substance Use Topics  . Smoking status: Current Every Day Smoker -- 1.00 packs/day    Types: Cigarettes  . Smokeless tobacco: None  . Alcohol Use: Yes     Comment: prior to being incarcerated    Review of Systems  All other systems reviewed and are negative.     Allergies  Chlorpromazine hcl; Haloperidol and related; Ketorolac tromethamine; Haldol; and Tramadol  Home Medications   Prior to Admission medications   Medication Sig Start Date End Date Taking? Authorizing Provider  divalproex (DEPAKOTE) 500 MG DR tablet Take 500 mg by mouth 3 (three) times daily.     Yes Historical Provider, MD  levothyroxine (SYNTHROID, LEVOTHROID) 100 MCG tablet Take 100 mcg by mouth daily.     Yes Historical Provider, MD   BP 139/84 mmHg  Pulse 106  Temp(Src) 98.4 F (36.9 C) (Oral)  Resp 16  Ht 5\' 9"  (1.753 m)  Wt 255 lb 9.6 oz (115.939 kg)  BMI 37.73 kg/m2  SpO2 96% Physical Exam  Constitutional: He is oriented to person, place, and time. He appears well-developed and well-nourished. No distress.  HENT:  Head: Normocephalic and atraumatic.  Eyes: Conjunctivae are normal.  Neck: Neck supple. No tracheal deviation present.  Cardiovascular: Normal rate and regular rhythm.   Pulmonary/Chest: Effort normal. No respiratory distress.  Abdominal: Soft. He exhibits no distension.  Musculoskeletal:       Right shoulder: He exhibits decreased range of motion (2/2 pain). He exhibits no deformity.  Lateral displacement of right scapula c/w prior presentations, no signs of glenohumeral dislocation  Neurological: He is alert and oriented to person, place, and time. He has normal strength. No cranial nerve deficit. GCS eye subscore is 4. GCS verbal subscore is 5. GCS motor subscore is 6.  Skin: Skin is warm and dry.  Psychiatric: He has a normal mood and affect.    ED Course  Procedures (including critical care time) Labs Review Labs Reviewed - No data to display  Imaging Review No results found. I have personally  reviewed and evaluated these images and lab results as part of my medical decision-making.   EKG Interpretation None      MDM   Final diagnoses:  Right shoulder pain  Left against medical advice    40 y.o. male presents with recurrent right posterior shoulder pain. Has been seen here previously for same. Pt with convulsive episode then fell to floor and has since resolved. In custody of police. immedicately asking for pain medication. Hs history of drug seeking behavior and appears to be exhibiting signs. I recommended the patient undergo  shoulder imaging. He has had multiple negative studies previously including CT and has had displacement of scapula which is similar to today's presentation but required no intervention. Pt refused imaging. Signed out AMA after discussion of possible missed injury if not willing to comply with workup. Pt capable of making decisions.     Lyndal Pulley, MD 07/16/15 [phone]

## 2015-07-15 NOTE — ED Notes (Signed)
Pt states that he is a difficult IV start having had EJ and ultrasound placement in the past.  RN attempted to start an ultrasound IV for Pt but he refuses stating that he needs to see the Dr. Now for his pain and that he will just take medicine by mouth or in muscle.  Pt also refuses blood work and shoulder xray stating that he needs pain medicine first.
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
## 2015-12-13 IMAGING — CT CT SHOULDER*R* W/O CM
2 of 3 series · 13 of 20 positions shown, 16 images · non-contrast
Comparison: Radiographs 02/28/2014

CLINICAL DATA: Shoulder pain.

EXAM:
CT OF THE RIGHT SHOULDER WITHOUT CONTRAST
TECHNIQUE: Multidetector CT imaging was performed according to the standard
protocol. Multiplanar CT image reconstructions were also generated.

[Series 3: shoulder st · axial · 0.45mm/px · z∈[-688,-550]mm · 12 of 56 slices shown, 15 images]
[im 5/56  soft-tissue]
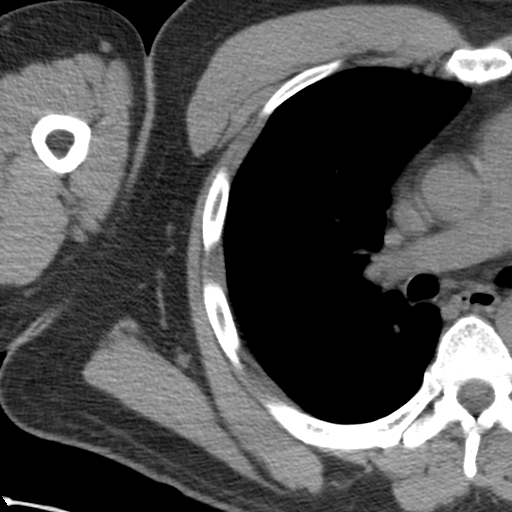
[im 5/56  bone]
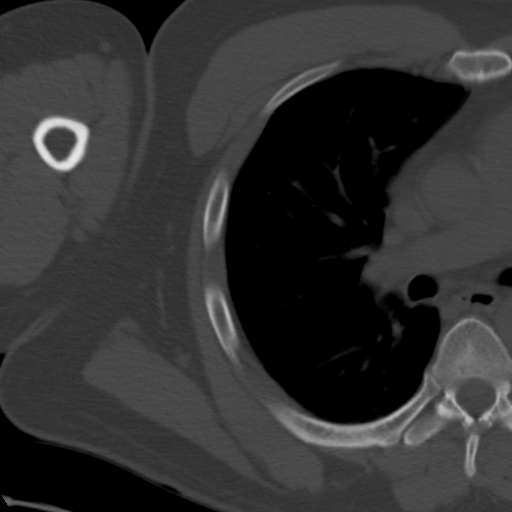
[im 9/56  bone]
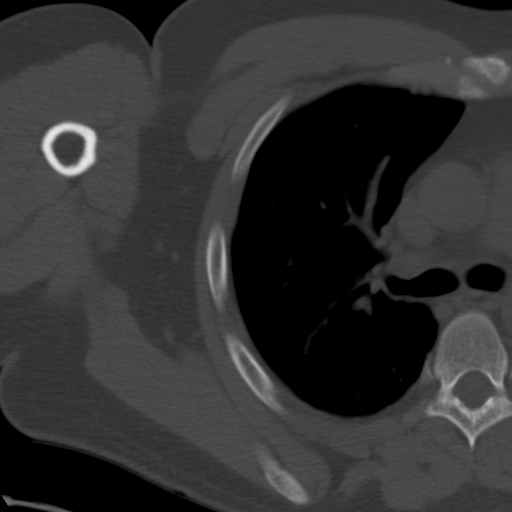
[im 13/56  bone]
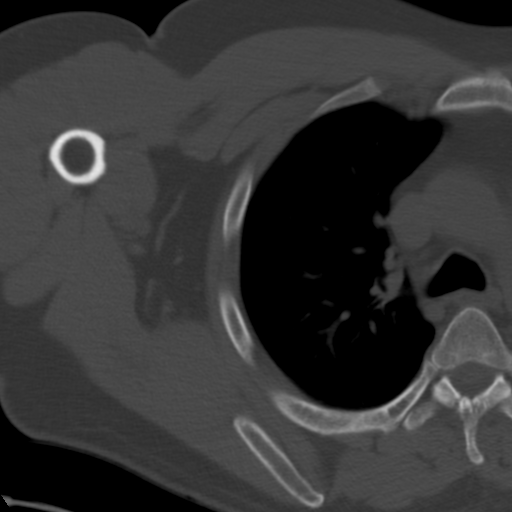
[im 17/56  bone]
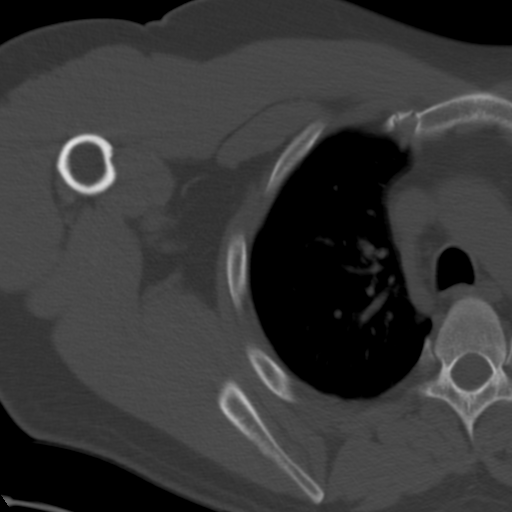
[im 22/56  soft-tissue]
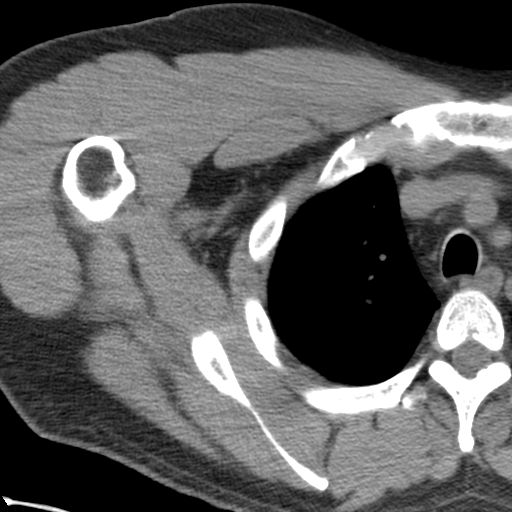
[im 22/56  bone]
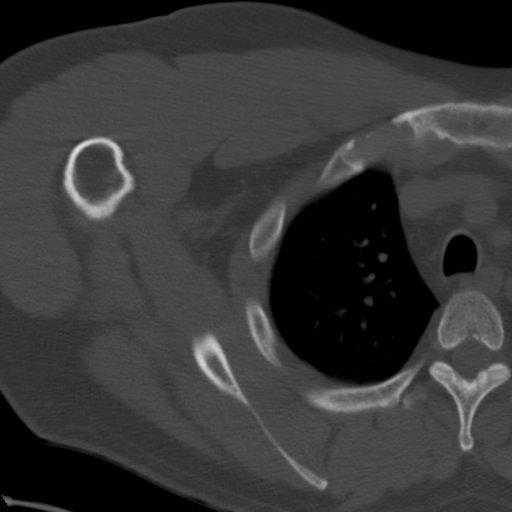
[im 26/56  bone]
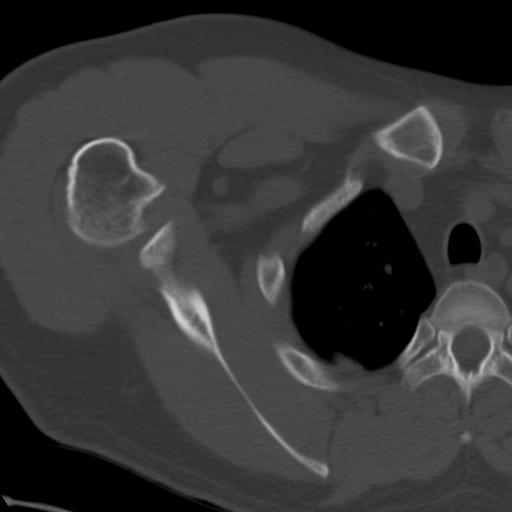
[im 30/56  bone]
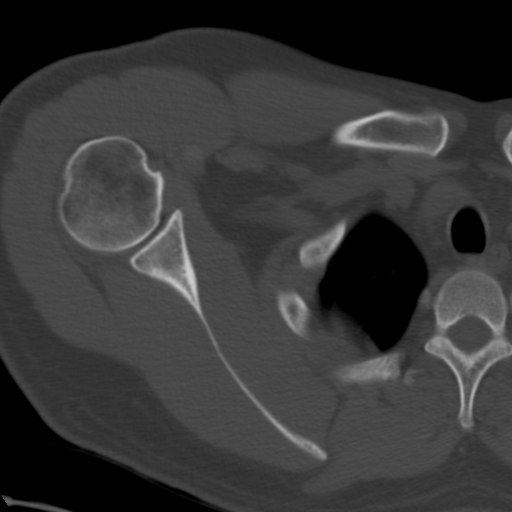
[im 34/56  bone]
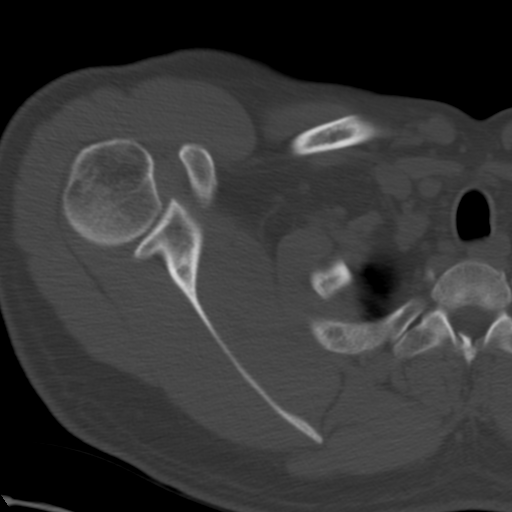
[im 39/56  soft-tissue]
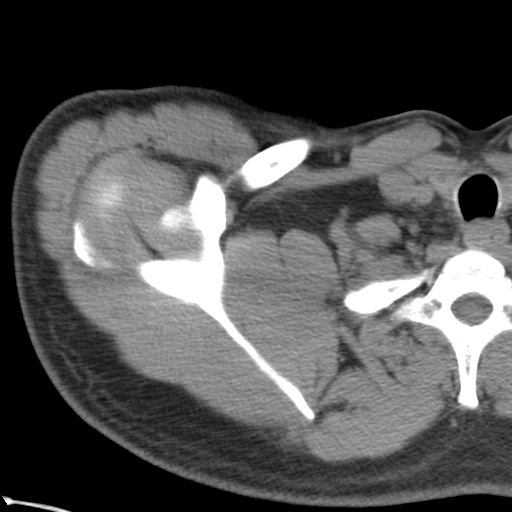
[im 39/56  bone]
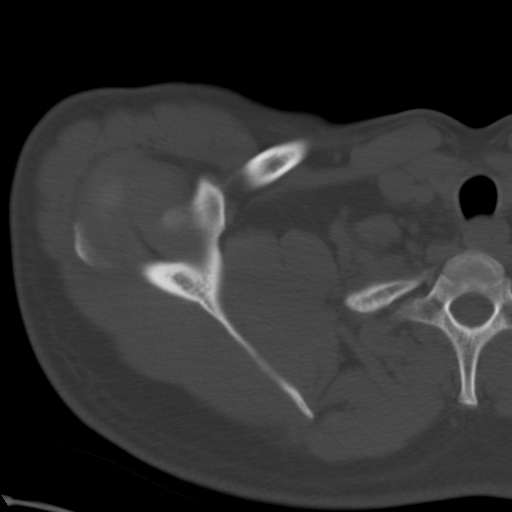
[im 43/56  bone]
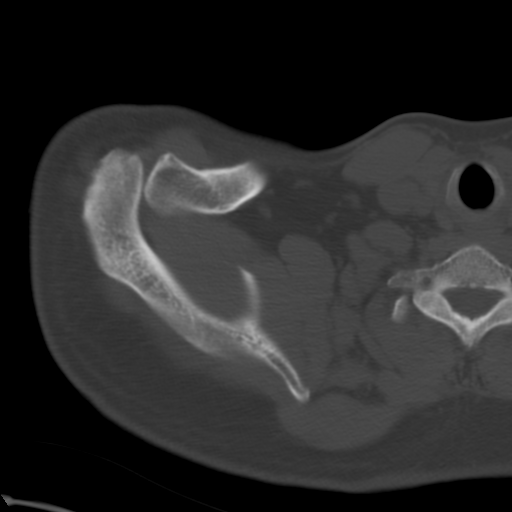
[im 47/56  bone]
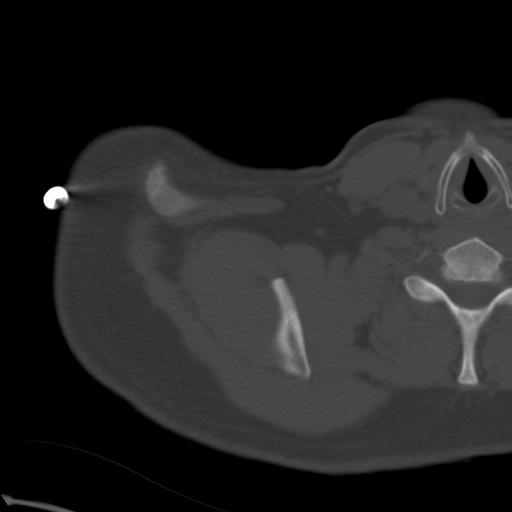
[im 51/56  bone]
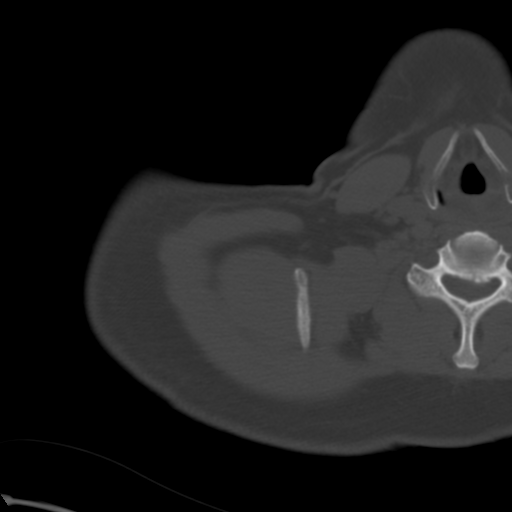

[Series 602: <mpr thick range> · coronal · 0.45mm/px · 1 of 74 slices shown]
[im 37/74  bone]
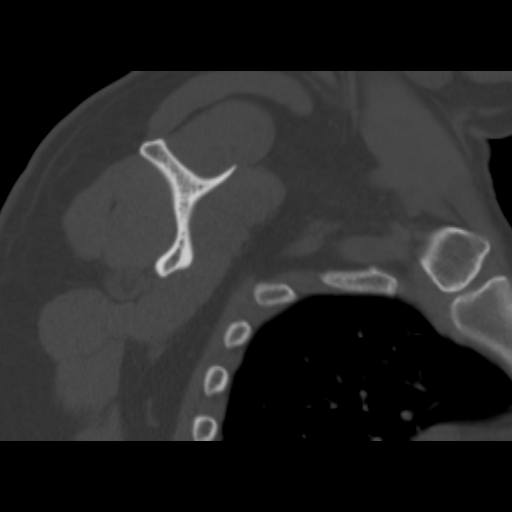

[13 of 20 positions shown; findings below may reference images not displayed]

FINDINGS: The glenohumeral joint is normal. No fracture or dislocation. The AC
joint is intact. No AC joint separation. The scapula appears
normally located. No dislocation or fracture. The periscapular
musculature appears normal. The rotator cuff muscles and tendons are
grossly normal.

The right lung is clear. No right rib fractures. The clavicle is
intact.
IMPRESSION: No fracture or dislocation.

## 2015-12-13 IMAGING — CR DG SHOULDER 2+V*R*
2 series · 2 of 2 positions shown · non-contrast
Comparison: 03/22/2011

CLINICAL DATA: Trauma with right shoulder pain

EXAM:
RIGHT SHOULDER - 2+ VIEW

[w shoulder external right]
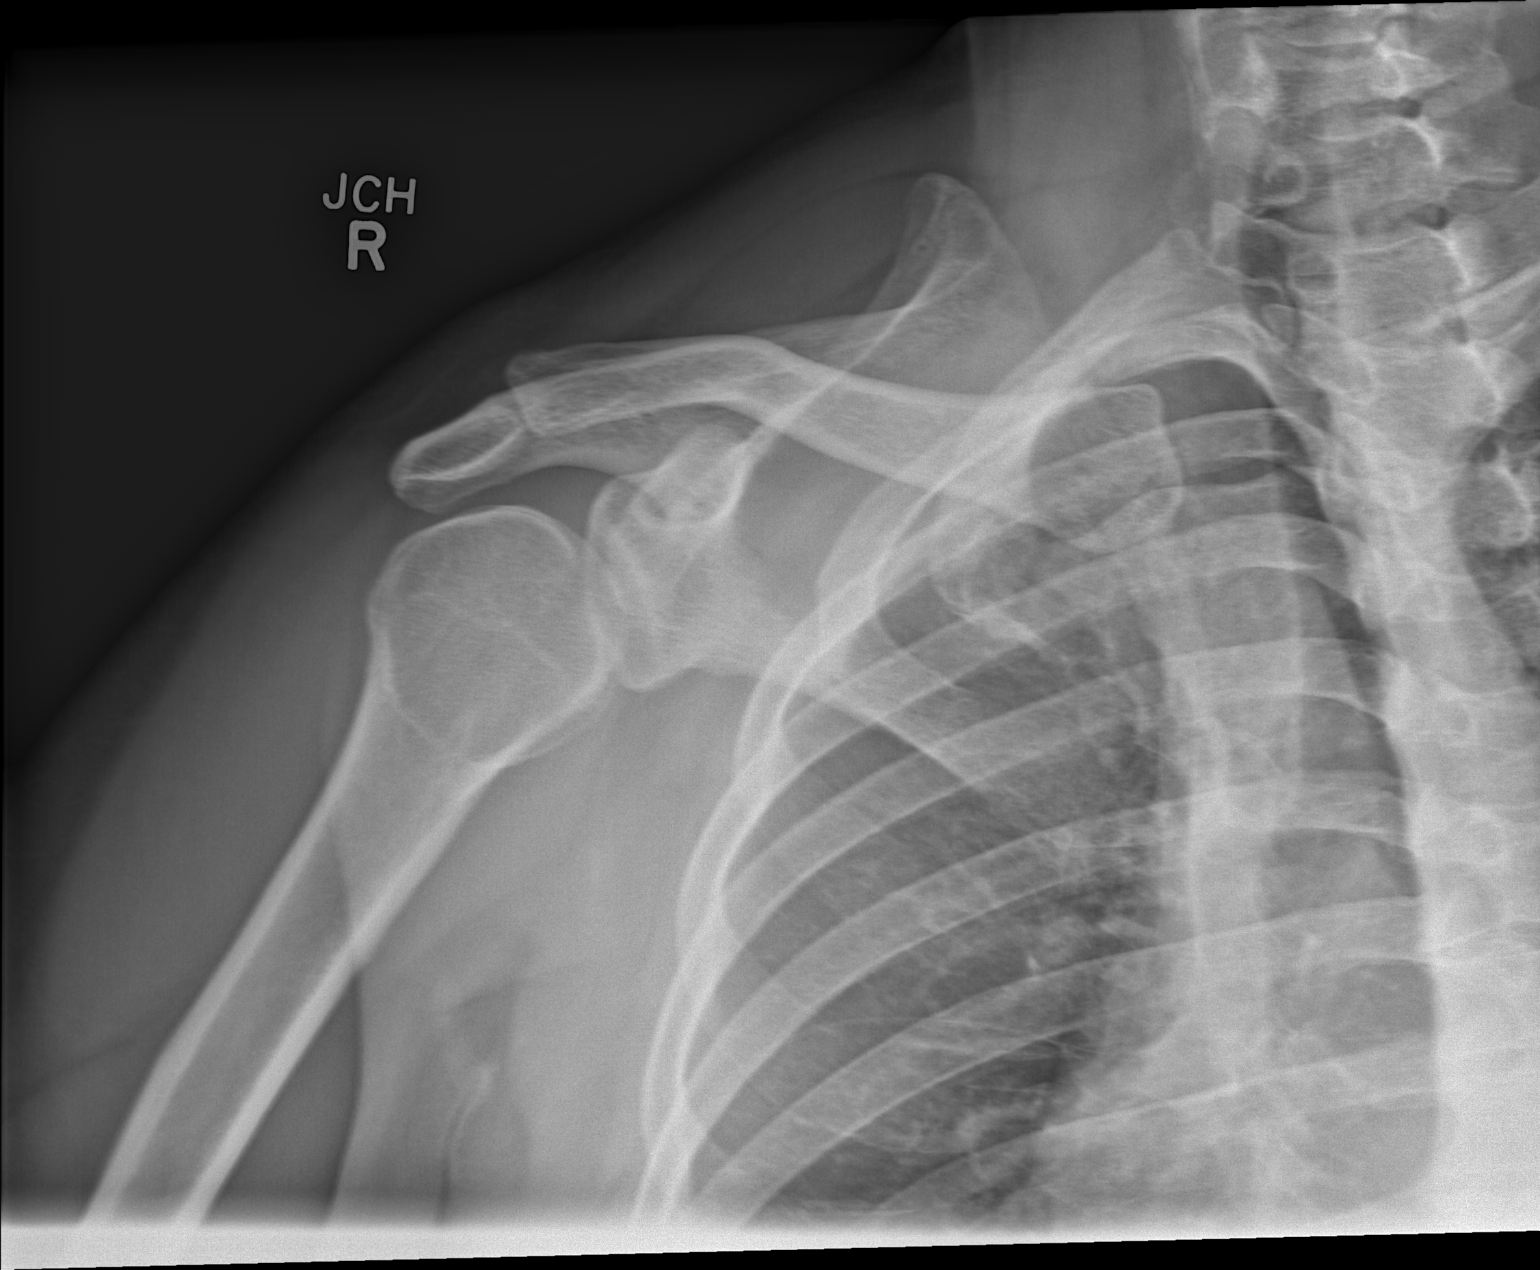

[w shoulder y-view right]
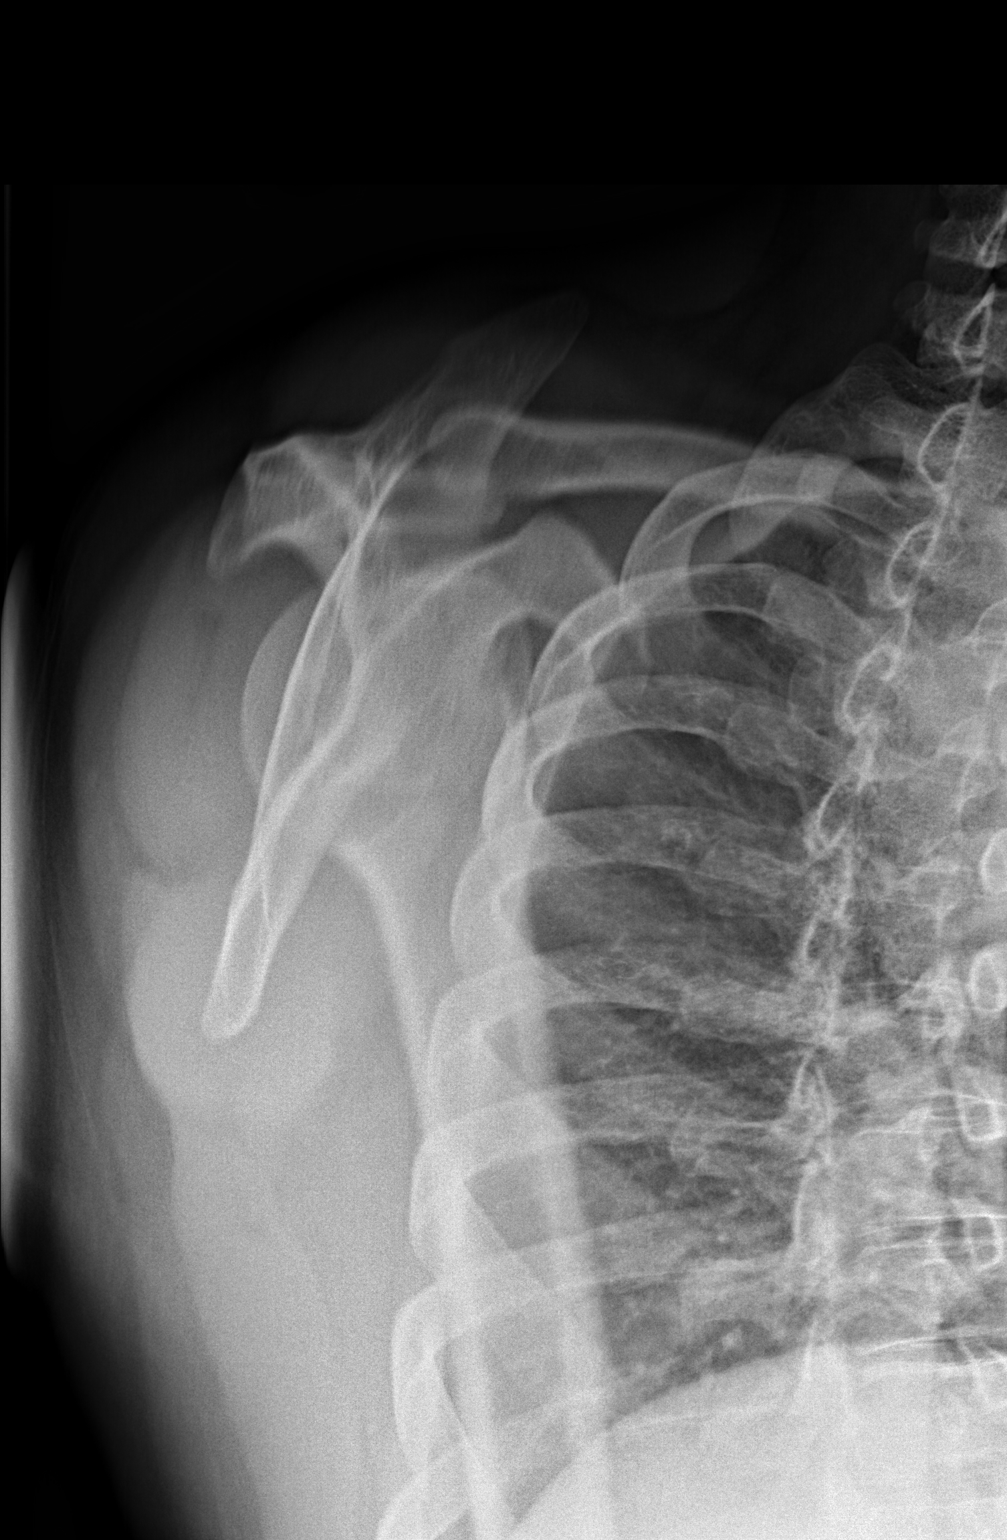

[2 of 2 positions shown; findings below may reference images not displayed]

FINDINGS: Image quality and diagnostic sensitivity is decreased by motion. The
scapula has a winged morphology, also seen in 8628. It is reducible
based on chest x-ray comparisons; no Jone on CT cervical
spine in 8628. Located glenohumeral and acromioclavicular joints. No
acute fracture.
IMPRESSION: 1. No acute osseous findings.
2. Chronic/recurrent winged scapula.

## 2018-08-12 ENCOUNTER — Encounter (HOSPITAL_COMMUNITY): Payer: Self-pay | Admitting: Emergency Medicine

## 2018-08-12 ENCOUNTER — Emergency Department (HOSPITAL_COMMUNITY)
Admission: EM | Admit: 2018-08-12 | Discharge: 2018-08-12 | Disposition: A | Discharge status: Home / Self Care | Payer: Medicaid Other | Attending: Emergency Medicine | Admitting: Emergency Medicine

## 2018-08-12 ENCOUNTER — Other Ambulatory Visit: Payer: Self-pay

## 2018-08-12 DIAGNOSIS — K0889 Other specified disorders of teeth and supporting structures: Secondary | ICD-10-CM | POA: Diagnosis present

## 2018-08-12 DIAGNOSIS — F17210 Nicotine dependence, cigarettes, uncomplicated: Secondary | ICD-10-CM | POA: Insufficient documentation

## 2018-08-12 HISTORY — DX: Hyperlipidemia, unspecified: E78.5

## 2018-08-12 MED ORDER — AMOXICILLIN 500 MG PO CAPS: 500.0000 mg | ORAL_CAPSULE | Freq: Three times a day (TID) | ORAL | 0 refills | Status: AC

## 2018-08-12 MED ORDER — DICLOFENAC SODIUM 50 MG PO TBEC: 50.0000 mg | DELAYED_RELEASE_TABLET | Freq: Two times a day (BID) | ORAL | 0 refills | Status: AC

## 2018-08-12 NOTE — ED Notes (Signed)
KS in to assess 

## 2018-08-12 NOTE — ED Notes (Signed)
Pt with loose upper tooth  Has called dentist but he requires 100 up front  Here for eval

## 2018-08-12 NOTE — Discharge Instructions (Addendum)
Return if any problems.

## 2018-08-12 NOTE — ED Provider Notes (Signed)
[facility] EMERGENCY DEPARTMENT Provider Note   CSN: [id number] Arrival date & time: 08/12/18  1157     History   Chief Complaint Chief Complaint  Patient presents with  . Dental Pain    HPI [name] is a 43 y.o. male.  The history is provided by the patient. No language interpreter was used.  Dental Pain  Location:  Upper Quality:  Aching Severity:  Moderate Onset quality:  Gradual Timing:  Constant Progression:  Worsening Chronicity:  New Context: filling fell out   Relieved by:  Nothing Worsened by:  Nothing Associated symptoms: facial pain   Risk factors: periodontal disease     Past Medical History:  Diagnosis Date  . Anxiety disorder   . Chronic back pain   . Chronic pain   . Chronic shoulder pain   . Drug-seeking behavior   . Epilepsy (HCC)   . History of electroencephalogram 2013   [facility], "pseudoseizures"  . Hyperlipidemia   . Hypothyroidism   . Malingering   . Polysubstance abuse (HCC)   . Pseudoseizure   . Seizure (HCC)   . Thyroid disease     There are no active problems to display for this patient.   History reviewed. No pertinent surgical history.      Home Medications    Prior to Admission medications   Medication Sig Start Date End Date Taking? Authorizing Provider  acetaminophen (TYLENOL) 500 MG tablet Take 1 tablet (500 mg total) by mouth every 6 (six) hours as needed. 07/15/15   Lyndal Pulley, MD  amoxicillin (AMOXIL) 500 MG capsule Take 1 capsule (500 mg total) by mouth 3 (three) times daily. 08/12/18   Elson Areas, PA-C  diclofenac (VOLTAREN) 50 MG EC tablet Take 1 tablet (50 mg total) by mouth 2 (two) times daily. 08/12/18   Elson Areas, PA-C  divalproex (DEPAKOTE) 500 MG DR tablet Take 500 mg by mouth 3 (three) times daily.     [provider]  levothyroxine (SYNTHROID, LEVOTHROID) 100 MCG tablet Take 100 mcg by mouth daily.      [provider]    Family History History reviewed. No  pertinent family history.  Social History Social History   Tobacco Use  . Smoking status: Current Every Day Smoker    Packs/day: 1.00    Types: Cigarettes  . Smokeless tobacco: Never Used  Substance Use Topics  . Alcohol use: Not Currently    Comment: prior to being incarcerated  . Drug use: Not Currently    Comment: Denies at this time. Hx of      Allergies   Chlorpromazine hcl; Haloperidol and related; Ketorolac tromethamine; Haldol [haloperidol]; and Tramadol   Review of Systems Review of Systems  All other systems reviewed and are negative.    Physical Exam Updated Vital Signs BP 135/81 (BP Location: Right Arm)   Pulse 87   Temp 98.1 F (36.7 C) (Oral)   Resp 15   Ht 5\' 9"  (1.753 m)   Wt 122.1 kg   SpO2 99%   BMI 39.74 kg/m   Physical Exam Vitals signs and nursing note reviewed.  Constitutional:      Appearance: He is well-developed.  HENT:     Head: Normocephalic.     Right Ear: Tympanic membrane normal.     Left Ear: Tympanic membrane normal.     Nose: Nose normal.     Mouth/Throat:     Mouth: Mucous membranes are moist.  Eyes:  Pupils: Pupils are equal, round, and reactive to light.  Neck:     Musculoskeletal: Normal range of motion.  Cardiovascular:     Rate and Rhythm: Normal rate.  Pulmonary:     Effort: Pulmonary effort is normal.  Abdominal:     General: Abdomen is flat. There is no distension.  Musculoskeletal: Normal range of motion.  Skin:    General: Skin is warm.  Neurological:     Mental Status: He is alert and oriented to person, place, and time.  Psychiatric:        Mood and Affect: Mood normal.      ED Treatments / Results  Labs (all labs ordered are listed, but only abnormal results are displayed) Labs Reviewed - No data to display  EKG None  Radiology No results found.  Procedures Procedures (including critical care time)  Medications Ordered in ED Medications - No data to display   Initial Impression  / Assessment and Plan / ED Course  I have reviewed the triage vital signs and the nursing notes.  Pertinent labs & imaging results that were available during my care of the patient were reviewed by me and considered in my medical decision making (see chart for details).     Pt given information about dental resources   Final Clinical Impressions(s) / ED Diagnoses   Final diagnoses:  Toothache  Pain, dental    ED Discharge Orders         Ordered    amoxicillin (AMOXIL) 500 MG capsule  3 times daily     08/12/18 1333    diclofenac (VOLTAREN) 50 MG EC tablet  2 times daily     08/12/18 1333        An After Visit Summary was printed and given to the patient.    Elson Areas, PA-C 08/12/18 1603    Eber Hong, MD 08/13/18 [phone]

## 2018-08-12 NOTE — ED Triage Notes (Signed)
Pt c/o a broken tooth to LT upper jaw that is causing him pain x 3 months. Pt states pain intensified over the past week. Pt unable to see dentist until medicaid starts.

## 2019-04-07 ENCOUNTER — Emergency Department (HOSPITAL_COMMUNITY): Payer: Medicaid Other

## 2019-04-07 ENCOUNTER — Emergency Department (HOSPITAL_COMMUNITY)
Admission: EM | Admit: 2019-04-07 | Discharge: 2019-04-07 | Discharge status: Against Medical Advice | Payer: Medicaid Other | Attending: Emergency Medicine | Admitting: Emergency Medicine

## 2019-04-07 ENCOUNTER — Other Ambulatory Visit: Payer: Self-pay

## 2019-04-07 DIAGNOSIS — Z5321 Procedure and treatment not carried out due to patient leaving prior to being seen by health care provider: Secondary | ICD-10-CM | POA: Diagnosis not present

## 2019-04-07 DIAGNOSIS — R369 Urethral discharge, unspecified: Secondary | ICD-10-CM | POA: Diagnosis not present

## 2019-04-07 DIAGNOSIS — M25511 Pain in right shoulder: Secondary | ICD-10-CM | POA: Diagnosis present

## 2019-04-07 MED ORDER — FENTANYL CITRATE (PF) 100 MCG/2ML IJ SOLN: 50.0000 ug | INTRAMUSCULAR | Status: DC | PRN

## 2019-04-07 NOTE — ED Notes (Addendum)
When attempting to give pt an ice pack for his shoulder until the doctor could come and see him the pt said he needed to see a doctor now. This nurse informed this pt that there was still a wait, but he would be see as soon as possible. Then pt continued to come out of his room into the hallway without his mask on and this nurse asked the pt if he would please put his mask on and stay in his room to reduce COVID exposure risk. Then, the pt reemerged from his room, had gotten himself dressed without any assistance, stated to this nurse "why are you being a bitch?!" This nurse informed the pt that, we all were just trying to keep him safe and not get him sick, she stated if he would please wear his mask and stay in his room. Pt then reemerged out of his room, called this nurse "a bitch" again and said, "I'm going to another hospital" and left [facility] after triage, would not sign an AMA form and threw his ID bracelet and icepack onto the floor in anger and in and this nurse no distress at this time.

## 2019-04-07 NOTE — ED Triage Notes (Addendum)
Pt presents to the ED from home while pushing the lawnmower into the garage he felt and heard his right shoulder "pop". Obvious deformity noted to shoulder. Pulse and movement intact distally but pt states from elbow down is numb. Pt states he has had this same shoulder dislocate in the past and they just "gave him a shot and yank it back in place with a sheet". Pt also states that he has had some discharge from his penis for about a week and was informed from his ex girlfriend that she had an STD but he doesn't know what kind.

## 2019-04-07 NOTE — ED Notes (Signed)
While this RN was talking to MD regarding patient's rising behavior regarding demanding to see a doctor and refusing to get an x-ray, patient was seen from MD's office throwing his ID bracelet on the floor and walking out of ED in no distress.  Patient left [facility] after triage. Patient did not sign AMA.

## 2019-04-07 NOTE — ED Notes (Signed)
When X ray came by attempting to take pt for xray's of shoulder pt refused to go to xray until he saw the doctor and was given pain medicine. Pt has called out multiple times demanding to be given pain medicine via a shot because "that's how they've done it all the times before". Pt has been seen at various surrounding hospitals for same along with "drug seeking behavior", see pts chart review.

## 2019-12-26 ENCOUNTER — Emergency Department (HOSPITAL_COMMUNITY)
Admission: EM | Admit: 2019-12-26 | Discharge: 2019-12-26 | Discharge status: Against Medical Advice | Payer: Medicaid Other | Attending: Emergency Medicine | Admitting: Emergency Medicine

## 2019-12-26 ENCOUNTER — Emergency Department (HOSPITAL_COMMUNITY): Payer: Medicaid Other

## 2019-12-26 ENCOUNTER — Encounter (HOSPITAL_COMMUNITY): Payer: Self-pay | Admitting: *Deleted

## 2019-12-26 ENCOUNTER — Other Ambulatory Visit: Payer: Self-pay

## 2019-12-26 DIAGNOSIS — R079 Chest pain, unspecified: Secondary | ICD-10-CM | POA: Insufficient documentation

## 2019-12-26 DIAGNOSIS — Z5321 Procedure and treatment not carried out due to patient leaving prior to being seen by health care provider: Secondary | ICD-10-CM | POA: Insufficient documentation

## 2019-12-26 MED ORDER — SODIUM CHLORIDE 0.9% FLUSH: 3.0000 mL | Freq: Once | INTRAVENOUS | Status: DC

## 2019-12-26 NOTE — ED Triage Notes (Signed)
Pt states he has been doing crystal meth and crystal ice this past weekend for 3 days straight and he is now having chest pain; pt states he took an aspirin and it helped with the pain
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
## 2021-02-15 ENCOUNTER — Ambulatory Visit: Admission: RE | Admit: 2021-02-15 | Payer: Self-pay | Source: Ambulatory Visit

## 2021-02-15 ENCOUNTER — Encounter: Payer: Self-pay | Admitting: Emergency Medicine

## 2021-02-15 ENCOUNTER — Emergency Department
Admission: EM | Admit: 2021-02-15 | Discharge: 2021-02-15 | Disposition: A | Discharge status: Home / Self Care | Payer: Medicaid Other | Attending: Emergency Medicine | Admitting: Emergency Medicine

## 2021-02-15 ENCOUNTER — Other Ambulatory Visit: Payer: Self-pay

## 2021-02-15 DIAGNOSIS — X509XXA Other and unspecified overexertion or strenuous movements or postures, initial encounter: Secondary | ICD-10-CM | POA: Diagnosis not present

## 2021-02-15 DIAGNOSIS — Z5321 Procedure and treatment not carried out due to patient leaving prior to being seen by health care provider: Secondary | ICD-10-CM | POA: Diagnosis not present

## 2021-02-15 DIAGNOSIS — S4991XA Unspecified injury of right shoulder and upper arm, initial encounter: Secondary | ICD-10-CM | POA: Diagnosis present

## 2021-02-15 NOTE — ED Notes (Signed)
Pt left flex wait and came back into triage area. Pt stated "how long until I see a doctor?" explained to pt that the sickest patients go back first but once his xray comes back if his shoulder is dislocated he will probably be roomed quickly. Pt requesting pain medicine. Informed pt that the doctor has to write for pain medicine and if his shoulder is dislocated he will receive IV pain meds. Pt stated "this is ridiculous, i'm hurting. This is crap hospital. My family is waiting outside." informed pt that no pt is allowed family while in the waiting room but can have family once in treatment room. Pt ripped off ID bracelet and threw it at this RN, opened door to lobby and proceeded to walk out.

## 2021-02-15 NOTE — ED Triage Notes (Signed)
Pt reports was moving a refrigerator and popped his right shoulder out of place.

## 2021-02-25 ENCOUNTER — Encounter (HOSPITAL_COMMUNITY): Payer: Self-pay

## 2021-02-25 ENCOUNTER — Other Ambulatory Visit: Payer: Self-pay

## 2021-02-25 ENCOUNTER — Emergency Department (HOSPITAL_COMMUNITY): Admission: EM | Admit: 2021-02-25 | Payer: Medicaid Other | Source: Home / Self Care

## 2021-02-25 ENCOUNTER — Emergency Department (HOSPITAL_COMMUNITY)
Admission: EM | Admit: 2021-02-25 | Discharge: 2021-02-25 | Disposition: A | Discharge status: Home / Self Care | Payer: Medicaid Other | Attending: Emergency Medicine | Admitting: Emergency Medicine

## 2021-02-25 DIAGNOSIS — Z765 Malingerer [conscious simulation]: Secondary | ICD-10-CM | POA: Insufficient documentation

## 2021-02-25 DIAGNOSIS — M21822 Other specified acquired deformities of left upper arm: Secondary | ICD-10-CM | POA: Diagnosis not present

## 2021-02-25 DIAGNOSIS — M21922 Unspecified acquired deformity of left upper arm: Secondary | ICD-10-CM | POA: Diagnosis not present

## 2021-02-25 DIAGNOSIS — M958 Other specified acquired deformities of musculoskeletal system: Secondary | ICD-10-CM

## 2021-02-25 DIAGNOSIS — M21921 Unspecified acquired deformity of right upper arm: Secondary | ICD-10-CM

## 2021-02-25 DIAGNOSIS — M25512 Pain in left shoulder: Secondary | ICD-10-CM | POA: Diagnosis present

## 2021-02-25 NOTE — ED Provider Notes (Signed)
[facility] EMERGENCY DEPARTMENT Provider Note   CSN: [id number] Arrival date & time: 02/25/21  4270     History Chief Complaint  Patient presents with   Shoulder Injury    [name] is a 45 y.o. male.  HPI 45 year old male who presents today complaining of right shoulder pain.  He states that he fell off of a lawnmower.  He states that he has had a few shoulder dislocations before.  He is requesting pain medicine.  Reports multiple allergies.  He states that usually if he gets pain medicine he can have it put back in by having it pulled on.  He denies any other injury today.     History reviewed. No pertinent past medical history.  There are no problems to display for this patient.   History reviewed. No pertinent surgical history.     No family history on file.     Home Medications Prior to Admission medications   Not on File    Allergies    Patient has no allergy information on record.  Review of Systems   Review of Systems  All other systems reviewed and are negative.  Physical Exam Updated Vital Signs BP 130/74 (BP Location: Left Arm)   Pulse 71   Temp 98.1 F (36.7 C) (Oral)   Resp 18   Ht 1.753 m (5\' 9" )   Wt (!) 141.5 kg   SpO2 99%   BMI 46.07 kg/m   Physical Exam Vitals reviewed.  Constitutional:      Appearance: Normal appearance. He is obese.  HENT:     Head: Normocephalic.     Right Ear: External ear normal.     Left Ear: External ear normal.     Nose: Nose normal.  Eyes:     Pupils: Pupils are equal, round, and reactive to light.  Cardiovascular:     Rate and Rhythm: Normal rate.  Pulmonary:     Effort: Pulmonary effort is normal.  Abdominal:     Palpations: Abdomen is soft.  Musculoskeletal:     Cervical back: Normal range of motion.     Comments: Right shoulder wearing with winged scapular deformity Radial pulses are intact Patient states he has no sensation throughout the entire lateral aspect of  right arm  Neurological:     Mental Status: He is alert.    ED Results / Procedures / Treatments   Labs (all labs ordered are listed, but only abnormal results are displayed) Labs Reviewed - No data to display  EKG None  Radiology No results found.  Procedures Procedures   Medications Ordered in ED Medications - No data to display  ED Course  I have reviewed the triage vital signs and the nursing notes.  Pertinent labs & imaging results that were available during my care of the patient were reviewed by me and considered in my medical decision making (see chart for details).    MDM Rules/Calculators/A&P                           Records reviewed with at least 15 visits to various institutions for shoulder dislocation.  Location.  Recorded chronic scapular deformity.  Diagnosis of drug-seeking behavior previously Patient advised we will get x-Holley Wirt before medicating. Patient left AGAINST MEDICAL ADVICE prior to x-rays obtained. Final Clinical Impression(s) / ED Diagnoses Final diagnoses:  Shoulder joint deformity, right  Winged scapula of right side  Drug-seeking behavior  Rx / DC Orders ED Discharge Orders     None        Margarita Grizzle, MD 02/25/21 1026

## 2021-02-25 NOTE — ED Triage Notes (Signed)
Pt fell off riding lawn mower and dislocated his right shoulder. Hx of same about 6 months ago. Denies any other injuries from fall. Did not hit his head or lose consciousness.

## 2021-02-25 NOTE — ED Notes (Signed)
Pt pressed call light 5x asking for pain medicine. MD aware, waiting for Xray. Pt education done, Xray was notified and they're on their way to do his Xray. Pt then stated "I'd rather go to another ER", walked out immediately, did not sign AMA form. Dr. Rosalia Hammers notified, MD acknowledged.

## 2021-02-25 NOTE — ED Provider Notes (Signed)
Emergency Medicine Provider Triage Evaluation Note  [name] , a 45 y.o. male  was evaluated in triage.  Pt complains of right shoulder injury after falling off lawn mower approximately 30 minutes ago. Denies head trauma. Hx prior shoulder dislocation 6 months ago.  Review of Systems  Positive: Right shoulder pain, numbness and tingling Negative: Head injury, lightheadedness  Physical Exam  There were no vitals taken for this visit. Gen:   Awake, no distress   Resp:  Normal effort  MSK:   Holding right arm in position of comfort. Moves all other extremities without difficulty Other:    Medical Decision Making  Medically screening exam initiated at 9:31 AM.  Appropriate orders placed.  [name] was informed that the remainder of the evaluation will be completed by another provider, this initial triage assessment does not replace that evaluation, and the importance of remaining in the ED until their evaluation is complete.     [name] 02/25/21 0272    Margarita Grizzle, MD 02/25/21 1010

## 2021-02-25 NOTE — ED Notes (Signed)
Pt refused IV access, insisted on getting a shot for pain instead. MD is at the bedside.

## 2021-02-27 ENCOUNTER — Emergency Department (HOSPITAL_COMMUNITY)
Admission: EM | Admit: 2021-02-27 | Discharge: 2021-02-28 | Discharge status: Against Medical Advice | Payer: Medicaid Other | Attending: Emergency Medicine | Admitting: Emergency Medicine

## 2021-02-27 ENCOUNTER — Emergency Department (HOSPITAL_COMMUNITY): Payer: Medicaid Other

## 2021-02-27 ENCOUNTER — Other Ambulatory Visit: Payer: Self-pay

## 2021-02-27 DIAGNOSIS — X500XXA Overexertion from strenuous movement or load, initial encounter: Secondary | ICD-10-CM | POA: Insufficient documentation

## 2021-02-27 DIAGNOSIS — Y9389 Activity, other specified: Secondary | ICD-10-CM | POA: Insufficient documentation

## 2021-02-27 DIAGNOSIS — S4991XA Unspecified injury of right shoulder and upper arm, initial encounter: Secondary | ICD-10-CM | POA: Diagnosis present

## 2021-02-27 DIAGNOSIS — T1490XA Injury, unspecified, initial encounter: Secondary | ICD-10-CM

## 2021-02-27 DIAGNOSIS — Z765 Malingerer [conscious simulation]: Secondary | ICD-10-CM | POA: Insufficient documentation

## 2021-02-27 DIAGNOSIS — Y9289 Other specified places as the place of occurrence of the external cause: Secondary | ICD-10-CM | POA: Diagnosis not present

## 2021-02-27 DIAGNOSIS — M25511 Pain in right shoulder: Secondary | ICD-10-CM

## 2021-02-27 MED ORDER — HYDROMORPHONE HCL 1 MG/ML IJ SOLN
1.0000 mg | Freq: Once | INTRAMUSCULAR | Status: AC
  Administered 2021-02-28: 1 mg via INTRAMUSCULAR
  Filled 2021-02-27: qty 1

## 2021-02-27 MED ORDER — HYDROMORPHONE HCL 2 MG/ML IJ SOLN: 2.0000 mg | Freq: Once | INTRAMUSCULAR | Status: DC

## 2021-02-27 NOTE — ED Triage Notes (Signed)
Patient arrives complaining of a possibly dislocated shoulder. Deformity is noted. Patient was attempting to help move a freezer when it slipped and he took all of the weight onto his arm. Patient states it has come out before.

## 2021-02-27 NOTE — ED Provider Notes (Signed)
[facility]EMERGENCY DEPT Provider Note   CSN: [id number] Arrival date & time: 02/27/21  2310     History Chief Complaint  Patient presents with   Shoulder Injury    +Deformity    [name] is a 45 y.o. male.  45 year old male presents to the emergency department for evaluation of right shoulder pain x 45 minutes.  He was attempting to help move a freezer when it slipped and he took all of the weight onto his arm.  He has a history of prior right shoulder dislocation which has been reduced in an ED setting previously.  He reports pain is constant and worse with movement.  He tried taking 800 mg ibuprofen for pain prior to arrival without relief.  Last PO intake 1 hour ago; at a "large pizza".  The history is provided by the patient. No language interpreter was used.  Shoulder Injury      No past medical history on file.  There are no problems to display for this patient.   ** The histories are not reviewed yet. Please review them in the "History" navigator section and refresh this SmartLink.     No family history on file.     Home Medications Prior to Admission medications   Not on File    Allergies    Haloperidol and Ketorolac tromethamine  Review of Systems   Review of Systems Ten systems reviewed and are negative for acute change, except as noted in the HPI.    Physical Exam Updated Vital Signs BP (!) 152/85   Pulse 89   Temp 98.3 F (36.8 C) (Oral)   Resp (!) 22   Ht 5\' 9"  (1.753 m)   Wt (!) 141.5 kg   SpO2 98%   BMI 46.07 kg/m   Physical Exam Vitals and nursing note reviewed.  Constitutional:      General: He is not in acute distress.    Appearance: He is well-developed. He is not diaphoretic.     Comments: Appears uncomfortable, nontoxic. Gripping at right shoulder.  HENT:     Head: Normocephalic and atraumatic.  Eyes:     General: No scleral icterus.    Conjunctiva/sclera: Conjunctivae normal.   Cardiovascular:     Rate and Rhythm: Normal rate and regular rhythm.     Pulses: Normal pulses.     Comments: Distal radial pulse 2+ in the RUE Pulmonary:     Effort: Pulmonary effort is normal. No respiratory distress.     Comments: Respirations even and unlabored Musculoskeletal:     Right shoulder: Deformity and tenderness present. Decreased range of motion.     Cervical back: Normal range of motion.  Skin:    General: Skin is warm and dry.     Coloration: Skin is not pale.     Findings: No erythema or rash.  Neurological:     Mental Status: He is alert and oriented to person, place, and time.  Psychiatric:        Behavior: Behavior normal.    ED Results / Procedures / Treatments   Labs (all labs ordered are listed, but only abnormal results are displayed) Labs Reviewed - No data to display  EKG None  Radiology DG Shoulder Right Port  Result Date: 02/28/2021 CLINICAL DATA:  Right shoulder pain. EXAM: PORTABLE RIGHT SHOULDER COMPARISON:  None. FINDINGS: Evaluation is limited due to body habitus and in the absence of axial view. No acute fracture or dislocation identified. The bones are  well mineralized. The soft tissues are unremarkable. IMPRESSION: No definite acute fracture or dislocation. Electronically Signed   By: Elgie Collard M.D.   On: 02/28/2021 00:02    Procedures Procedures   Medications Ordered in ED Medications  HYDROmorphone (DILAUDID) injection 1 mg (has no administration in time range)  HYDROmorphone (DILAUDID) injection 1 mg (1 mg Intramuscular Given 02/28/21 0003)    ED Course  I have reviewed the triage vital signs and the nursing notes.  Pertinent labs & imaging results that were available during my care of the patient were reviewed by me and considered in my medical decision making (see chart for details).  Clinical Course as of 02/28/21 0100  Sat Feb 28, 2021  0001 X-rays reviewed at bedside.  There are findings consistent with anterior  dislocation.  Question associated fracture.  Pending formal radiology read. [KH]  0037 Plan for CT R shoulder for further characterization of injury. [KH]  [id number] Patient marked for merge with MRN [id number]. While name and birth date have discrepancies, phone numbers listed under both charts match and both photos resemble the patient. Complaints listed on chart marked for merge also with multiple visits for R shoulder pain and reported dislocation. Has been seen combined number of 8 times in the past 3 weeks for this complaint. Concern for DSB. [KH]  [phone] Patient now feels he is cured after his second IM shot of dilaudid. Moving his RUE with RN at bedside. Does not wish to remain in the department for CT scan. Has elected to sign out AMA. [KH]    Clinical Course User Index [KH] Antony Madura, PA-C   MDM Rules/Calculators/A&P                           45 year old male complaining of right shoulder pain after alleging that he was attempting to move a freezer.  Neurovascularly intact reporting concern for right shoulder dislocation.  Reports a history of same.  His x-ray is inconclusive.  Decision was made to proceed with CT shoulder for further characterization of injury.  Patient, however, feels that his symptoms have improved with IM Dilaudid.  He is not willing to remain in the department for further work-up.  He will be discharged AGAINST MEDICAL ADVICE.  On extensive chart review there is concern for drug-seeking behavior.  His chart aligns with another patient MRN with identical images and phone numbers despite discrepancy in name and birth date. Both charts reveal frequent visits for c/o R shoulder pain.  Patient departed the ED in stable condition.   Final Clinical Impression(s) / ED Diagnoses Final diagnoses:  Injury  Right shoulder pain, unspecified chronicity  Drug-seeking behavior    Rx / DC Orders ED Discharge Orders     None        Antony Madura, PA-C 02/28/21 0102     Glynn Octave, MD 02/28/21 1510

## 2021-02-28 MED ORDER — HYDROMORPHONE HCL 1 MG/ML IJ SOLN
1.0000 mg | Freq: Once | INTRAMUSCULAR | Status: AC
  Administered 2021-02-28: 1 mg via INTRAMUSCULAR

## 2021-02-28 MED ORDER — HYDROMORPHONE HCL 1 MG/ML IJ SOLN
1.0000 mg | Freq: Once | INTRAMUSCULAR | Status: DC
  Filled 2021-02-28: qty 1

## 2021-02-28 NOTE — ED Notes (Signed)
Patient came with his significant other. Patient refused a IV and only wanted medication IM. Patient also drink liquids even though he was advised not to drink anything. As soon as he got his last dose of medication. He stated " OMG its a miracle my shoulder is back in place" " Tammy Tammy wake up look my shoulder is back in place" Patient proceeded to swing his arm in a circle. Then left AMA.

## 2021-07-23 ENCOUNTER — Emergency Department (HOSPITAL_COMMUNITY): Payer: Medicaid Other

## 2021-07-23 ENCOUNTER — Encounter (HOSPITAL_COMMUNITY): Payer: Self-pay

## 2021-07-23 ENCOUNTER — Emergency Department (HOSPITAL_COMMUNITY)
Admission: EM | Admit: 2021-07-23 | Discharge: 2021-07-23 | Disposition: A | Discharge status: Home / Self Care | Payer: Medicaid Other | Attending: Emergency Medicine | Admitting: Emergency Medicine

## 2021-07-23 ENCOUNTER — Other Ambulatory Visit: Payer: Self-pay

## 2021-07-23 DIAGNOSIS — S43004A Unspecified dislocation of right shoulder joint, initial encounter: Secondary | ICD-10-CM | POA: Diagnosis not present

## 2021-07-23 DIAGNOSIS — X500XXA Overexertion from strenuous movement or load, initial encounter: Secondary | ICD-10-CM | POA: Insufficient documentation

## 2021-07-23 DIAGNOSIS — G8929 Other chronic pain: Secondary | ICD-10-CM

## 2021-07-23 DIAGNOSIS — S4991XA Unspecified injury of right shoulder and upper arm, initial encounter: Secondary | ICD-10-CM | POA: Diagnosis present

## 2021-07-23 MED ORDER — HYDROMORPHONE HCL 2 MG/ML IJ SOLN
2.0000 mg | Freq: Once | INTRAMUSCULAR | Status: AC
  Administered 2021-07-23: 2 mg via INTRAMUSCULAR
  Filled 2021-07-23: qty 1

## 2021-07-23 NOTE — ED Notes (Addendum)
Pt said that his shoulder went back into place and he is ready to go. Refused ct or to wait for discharge.

## 2021-07-23 NOTE — ED Triage Notes (Signed)
Possible right shoulder dislocation after lifting a car to place the jack under neath it. Heard a "pop", noticed poor ROM and right arm became numb.

## 2021-07-23 NOTE — ED Notes (Signed)
Pt refusing CT scan at this time. RN aware.

## 2021-07-23 NOTE — ED Notes (Signed)
Patient started to say that his shoulder feels better after receiving pain medication and wants to get discharged. Nurse told him to wait for his discharge papers but patient did not wait and decided to leave.

## 2021-07-23 NOTE — ED Provider Notes (Signed)
[facility]-EMERGENCY DEPT Provider Note   CSN: [id number] Arrival date & time: 07/23/21  2039     History  Chief Complaint  Patient presents with   Shoulder Injury    [name] is a 46 y.o. male.  46 year old male presents with recurrent right shoulder dislocation.  States he was lifting a heavy object and felt a pop.  Review of the old chart shows that he has had this multiple times in the past.  States he is also seen orthopedist for this.  Denies any numbness or weakness to his hand.      Home Medications Prior to Admission medications   Medication Sig Start Date End Date Taking? Authorizing Provider  acetaminophen (TYLENOL) 500 MG tablet Take 1 tablet (500 mg total) by mouth every 6 (six) hours as needed. Patient not taking: Reported on 04/07/2019 07/15/15   Lyndal Pulley, MD  amoxicillin (AMOXIL) 500 MG capsule Take 1 capsule (500 mg total) by mouth 3 (three) times daily. Patient not taking: Reported on 04/07/2019 08/12/18   Elson Areas, PA-C  diclofenac (VOLTAREN) 50 MG EC tablet Take 1 tablet (50 mg total) by mouth 2 (two) times daily. Patient not taking: Reported on 04/07/2019 08/12/18   Elson Areas, PA-C  ibuprofen (ADVIL) 200 MG tablet Take 800 mg by mouth every 6 (six) hours as needed for moderate pain.    [provider]      Allergies    Chlorpromazine, Chlorpromazine hcl, Haloperidol, Haloperidol and related, Iodinated contrast media, Ketorolac, Ketorolac tromethamine, Ketorolac tromethamine, Toradol [ketorolac tromethamine], Iodine, Haldol [haloperidol], Tramadol, Haloperidol lactate, and Morphine    Review of Systems   Review of Systems  All other systems reviewed and are negative.  Physical Exam Updated Vital Signs BP (!) 156/91 (BP Location: Left Arm)    Pulse 93    Temp 99.1 F (37.3 C) (Oral)    Resp (!) 22    Ht 1.753 m (5\' 9" )    Wt 136.1 kg    SpO2 95%    BMI 44.30 kg/m  Physical Exam Vitals and nursing note  reviewed.  Constitutional:      General: He is not in acute distress.    Appearance: Normal appearance. He is well-developed. He is not toxic-appearing.  HENT:     Head: Normocephalic and atraumatic.  Eyes:     General: Lids are normal.     Conjunctiva/sclera: Conjunctivae normal.     Pupils: Pupils are equal, round, and reactive to light.  Neck:     Thyroid: No thyroid mass.     Trachea: No tracheal deviation.  Cardiovascular:     Rate and Rhythm: Normal rate and regular rhythm.     Heart sounds: Normal heart sounds. No murmur heard.   No gallop.  Pulmonary:     Effort: Pulmonary effort is normal. No respiratory distress.     Breath sounds: Normal breath sounds. No stridor. No decreased breath sounds, wheezing, rhonchi or rales.  Abdominal:     General: There is no distension.     Palpations: Abdomen is soft.     Tenderness: There is no abdominal tenderness. There is no rebound.  Musculoskeletal:        General: No tenderness.     Right shoulder: Deformity and bony tenderness present. Decreased range of motion.     Cervical back: Normal range of motion and neck supple.     Comments: Neurovascular status intact at right hand  Skin:  General: Skin is warm and dry.     Findings: No abrasion or rash.  Neurological:     Mental Status: He is alert and oriented to person, place, and time. Mental status is at baseline.     GCS: GCS eye subscore is 4. GCS verbal subscore is 5. GCS motor subscore is 6.     Cranial Nerves: No cranial nerve deficit.     Sensory: No sensory deficit.     Motor: Motor function is intact.  Psychiatric:        Attention and Perception: Attention normal.        Speech: Speech normal.        Behavior: Behavior normal.    ED Results / Procedures / Treatments   Labs (all labs ordered are listed, but only abnormal results are displayed) Labs Reviewed - No data to display  EKG None  Radiology DG Shoulder Right  Result Date: 07/23/2021 CLINICAL  DATA:  Possible dislocation. EXAM: RIGHT SHOULDER - 2+ VIEW COMPARISON:  Right shoulder x-ray 01/16/2021. FINDINGS: There are findings suspicious for posterior subluxation/dislocation. No fractures are identified. Joint spaces are otherwise well maintained. IMPRESSION: 1. Findings suspicious for posterior subluxation/dislocation of the right shoulder. Consider further evaluation with axillary view. Electronically Signed   By: Darliss Cheney M.D.   On: 07/23/2021 21:04    Procedures Procedures    Medications Ordered in ED Medications  HYDROmorphone (DILAUDID) injection 2 mg (has no administration in time range)    ED Course/ Medical Decision Making/ A&P                           Medical Decision Making  Patient medicated for pain with IM hydromorphone.  All history reviewed and suspicious for patient possibly subluxing his shoulder.  CT of the shoulder was ordered to evaluate for this.  After patient received his dose of medication, patient eloped from the department.        Final Clinical Impression(s) / ED Diagnoses Final diagnoses:  None    Rx / DC Orders ED Discharge Orders     None         Lorre Nick, MD 07/23/21 2152

## 2022-08-19 ENCOUNTER — Ambulatory Visit (INDEPENDENT_AMBULATORY_CARE_PROVIDER_SITE_OTHER): Payer: Medicaid Other

## 2022-08-19 ENCOUNTER — Ambulatory Visit (HOSPITAL_COMMUNITY)
Admission: EM | Admit: 2022-08-19 | Discharge: 2022-08-19 | Disposition: A | Discharge status: Home / Self Care | Payer: Medicaid Other | Attending: Family Medicine | Admitting: Family Medicine

## 2022-08-19 ENCOUNTER — Encounter (HOSPITAL_COMMUNITY): Payer: Self-pay

## 2022-08-19 DIAGNOSIS — G8929 Other chronic pain: Secondary | ICD-10-CM

## 2022-08-19 DIAGNOSIS — M545 Low back pain, unspecified: Secondary | ICD-10-CM

## 2022-08-19 HISTORY — DX: Dorsalgia, unspecified: M54.9

## 2022-08-19 MED ORDER — NAPROXEN 500 MG PO TABS: 500.0000 mg | ORAL_TABLET | Freq: Two times a day (BID) | ORAL | 0 refills | Status: AC | PRN

## 2022-08-19 MED ORDER — OXYCODONE HCL 5 MG PO TABS: 5.0000 mg | ORAL_TABLET | Freq: Four times a day (QID) | ORAL | 0 refills | Status: AC | PRN

## 2022-08-19 MED ORDER — KETOROLAC TROMETHAMINE 30 MG/ML IJ SOLN
30.0000 mg | Freq: Once | INTRAMUSCULAR | Status: AC
  Administered 2022-08-19: 30 mg via INTRAMUSCULAR

## 2022-08-19 MED ORDER — KETOROLAC TROMETHAMINE 30 MG/ML IJ SOLN
INTRAMUSCULAR | Status: AC
  Filled 2022-08-19: qty 1

## 2022-08-19 NOTE — Discharge Instructions (Signed)
Your x-ray showed some arthritis but no broken bone or other bone deformity.  You have been given a shot of Toradol 30 mg today.  Take naproxen 500 mg--1 tablet every 12 hours as needed for pain  Please call your primary care provider this afternoon when you get home and make a follow-up appointment about this issue.

## 2022-08-19 NOTE — ED Triage Notes (Signed)
Chief Complaint: Patient fell at [facility] a while back. 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 States can feel "knots and holes" In his spine. Pain affecting ambulation.  No recent falls or heavy lifting.   Onset: over a week.   Prescriptions or OTC medications tried: Yes- Toradol, ibuprofen     with no relief

## 2022-08-19 NOTE — ED Provider Notes (Addendum)
[name]    CSN: [id number] Arrival date & time: 08/19/22  1244      History   Chief Complaint Chief Complaint  Patient presents with   Back Pain    HPI [name] is a 47 y.o. male.    Back Pain  Here for left low back pain.  In the last month or so it is begun bothering him a lot.  He states that in October he was admitted to a psychiatric unit and fell when pulling his IV.  He did not really hurt his back at the time, but he had MRI done apparently showed spinal stenosis at some level.  He did see a spine specialist either in the hospital or immediately thereafter.  He takes gabapentin consistently.  Oxycodone made him feel dizzy.  Apparently his primary provider has tried him on muscle relaxers and that has not helped.  Currently he has been taking ibuprofen and tramadol, and neither one of them are helping.  Initially nursing staff understood him to state he was allergic to Toradol.  When I reviewed his allergies with him he states he is taken Toradol injections in the past without any side effect or allergy.  What he is allergic to is Haldol  He also takes Risperdal No paresthesias or numbness or muscle weakness.  No fever, and no bowel or bladder incontinence  Past Medical History:  Diagnosis Date   Spine pain     There are no problems to display for this patient.   History reviewed. No pertinent surgical history.     Home Medications    Prior to Admission medications   Medication Sig Start Date End Date Taking? Authorizing Provider  naproxen (NAPROSYN) 500 MG tablet Take 1 tablet (500 mg total) by mouth 2 (two) times daily as needed (pain). 08/19/22  Yes Kaitlynn Tramontana, Gwenlyn Perking, MD  oxyCODONE (ROXICODONE) 5 MG immediate release tablet Take 1 tablet (5 mg total) by mouth every 6 (six) hours as needed for severe pain. 08/19/22  Yes Barrett Henle, MD    Family History History reviewed. No pertinent family history.  Social History Social  History   Tobacco Use   Smoking status: Every Day    Types: Cigarettes   Smokeless tobacco: Never  Vaping Use   Vaping Use: Never used  Substance Use Topics   Alcohol use: Not Currently   Drug use: Never     Allergies   Haloperidol and related   Review of Systems Review of Systems  Musculoskeletal:  Positive for back pain.     Physical Exam Triage Vital Signs ED Triage Vitals  Enc Vitals Group     BP 08/19/22 1314 112/70     Pulse Rate 08/19/22 1314 (!) 104     Resp 08/19/22 1314 16     Temp 08/19/22 1314 98.1 F (36.7 C)     Temp Source 08/19/22 1314 Oral     SpO2 08/19/22 1314 97 %     Weight 08/19/22 1313 250 lb (113.4 kg)     Height 08/19/22 1313 5\' 9"  (1.753 m)     Head Circumference --      Peak Flow --      Pain Score 08/19/22 1311 10     Pain Loc --      Pain Edu? --      Excl. in [place]? --    No data found.  Updated Vital Signs BP 112/70 (BP Location: Left Arm)   Pulse [name] Kitchen)  104   Temp 98.1 F (36.7 C) (Oral)   Resp 16   Ht 5\' 9"  (1.753 m)   Wt 113.4 kg   SpO2 97%   BMI 36.92 kg/m   Visual Acuity Right Eye Distance:   Left Eye Distance:   Bilateral Distance:    Right Eye Near:   Left Eye Near:    Bilateral Near:     Physical Exam Vitals reviewed.  Constitutional:      General: He is not in acute distress.    Appearance: He is not ill-appearing, toxic-appearing or diaphoretic.  Cardiovascular:     Rate and Rhythm: Normal rate and regular rhythm.  Musculoskeletal:     Comments: He is tender over his left lumbosacral area.  There is no rash or erythema.  No deformity.  He feels that there is a divot in his tissues on the left lumbosacral area, but I cannot palpate any abnormality there.  Skin:    Coloration: Skin is not jaundiced or pale.  Neurological:     General: No focal deficit present.     Mental Status: He is alert and oriented to person, place, and time.  Psychiatric:        Behavior: Behavior normal.      UC Treatments /  Results  Labs (all labs ordered are listed, but only abnormal results are displayed) Labs Reviewed - No data to display  EKG   Radiology DG Lumbar Spine 2-3 Views  Result Date: 08/19/2022 CLINICAL DATA:  Left lower back pain after fall several months ago. EXAM: LUMBAR SPINE - 2-3 VIEW COMPARISON:  None Available. FINDINGS: There is no evidence of lumbar spine fracture. Alignment is normal. Intervertebral disc spaces are maintained. Mild anterior osteophyte formation is noted at C2-3, C3-4 and C4-5. IMPRESSION: Mild multilevel degenerative changes.  No acute abnormality seen. Electronically Signed   By: [name] M.D.   On: 08/19/2022 13:55    Procedures Procedures (including critical care time)  Medications Ordered in UC Medications  ketorolac (TORADOL) 30 MG/ML injection 30 mg (30 mg Intramuscular Given 08/19/22 1415)    Initial Impression / Assessment and Plan / UC Course  I have reviewed the triage vital signs and the nursing notes.  Pertinent labs & imaging results that were available during my care of the patient were reviewed by me and considered in my medical decision making (see chart for details).        X-ray shows some arthritic changes, but no disc space narrowing.  No bony fracture.  He is given an injection of Toradol today and naproxen is sent for his pain.  He is already taking gabapentin.  He does have follow-up with spine surgery later this month on approximately February 26.   When I discussed the prescriptions I had sent with the patient, he states he already has naproxen and that it is not helping.  He states that oxycodone has helped the best.  It makes him "swimmy headed", but he states that he made his last prescription last a couple of months.  I can tell on PMP that he has not filled anything since November of last year.  12 quantity of oxycodone 5 mg are sent in for him to take sparingly.  I did review with him that he cannot get any further narcotic  prescriptions from this facility for the same chronic condition.   Then he wanted the rx sent to a different (3rd one he had chosen), and I had  already sent it to [facility]. I did not resend the rx Final Clinical Impressions(s) / UC Diagnoses   Final diagnoses:  Chronic left-sided low back pain without sciatica     Discharge Instructions      Your x-ray showed some arthritis but no broken bone or other bone deformity.  You have been given a shot of Toradol 30 mg today.  Take naproxen 500 mg--1 tablet every 12 hours as needed for pain  Please call your primary care provider this afternoon when you get home and make a follow-up appointment about this issue.     ED Prescriptions     Medication Sig Dispense Auth. Provider   naproxen (NAPROSYN) 500 MG tablet Take 1 tablet (500 mg total) by mouth 2 (two) times daily as needed (pain). 30 tablet Kamariyah Timberlake, Gwenlyn Perking, MD   oxyCODONE (ROXICODONE) 5 MG immediate release tablet Take 1 tablet (5 mg total) by mouth every 6 (six) hours as needed for severe pain. 12 tablet Shatona Andujar, Gwenlyn Perking, MD      I have reviewed the PDMP during this encounter.   Barrett Henle, MD 08/19/22 1407    Barrett Henle, MD 08/19/22 1416    Barrett Henle, MD 08/19/22 [phone]

## 2022-10-27 ENCOUNTER — Emergency Department (HOSPITAL_BASED_OUTPATIENT_CLINIC_OR_DEPARTMENT_OTHER): Payer: Medicaid Other

## 2022-10-27 ENCOUNTER — Other Ambulatory Visit: Payer: Self-pay

## 2022-10-27 ENCOUNTER — Emergency Department (HOSPITAL_BASED_OUTPATIENT_CLINIC_OR_DEPARTMENT_OTHER)
Admission: EM | Admit: 2022-10-27 | Discharge: 2022-10-27 | Discharge status: Against Medical Advice | Payer: Medicaid Other | Attending: Emergency Medicine | Admitting: Emergency Medicine

## 2022-10-27 ENCOUNTER — Encounter (HOSPITAL_BASED_OUTPATIENT_CLINIC_OR_DEPARTMENT_OTHER): Payer: Self-pay

## 2022-10-27 DIAGNOSIS — F17210 Nicotine dependence, cigarettes, uncomplicated: Secondary | ICD-10-CM | POA: Insufficient documentation

## 2022-10-27 DIAGNOSIS — M25522 Pain in left elbow: Secondary | ICD-10-CM | POA: Diagnosis present

## 2022-10-27 DIAGNOSIS — R569 Unspecified convulsions: Secondary | ICD-10-CM | POA: Insufficient documentation

## 2022-10-27 DIAGNOSIS — M545 Low back pain, unspecified: Secondary | ICD-10-CM | POA: Diagnosis not present

## 2022-10-27 DIAGNOSIS — E039 Hypothyroidism, unspecified: Secondary | ICD-10-CM | POA: Insufficient documentation

## 2022-10-27 MED ORDER — ACETAMINOPHEN 500 MG PO TABS
1000.0000 mg | ORAL_TABLET | Freq: Once | ORAL | Status: DC
  Filled 2022-10-27: qty 2

## 2022-10-27 NOTE — ED Notes (Signed)
Pt refused the blood work and cardiac monitoring. Asking for food. Given a bag of popcorn and cup of water.

## 2022-10-27 NOTE — ED Notes (Signed)
Pt is refusing to be d/c and is becoming verbally abusive and aggressive. Security at the bedside to escort pt out. Pt refused d/c instructions

## 2022-10-27 NOTE — ED Notes (Signed)
Radiology personnel at bedside to take patient to imaging. Patient declined CT and XR.

## 2022-10-27 NOTE — ED Provider Notes (Addendum)
[facility] Emergency Department Provider Note MRN:  [id number]  Arrival date & time: 10/27/22     Chief Complaint   Seizures   History of Present Illness   [name] is a 47 y.o. year-old male with a history of epilepsy presenting to the ED with chief complaint of seizures.  Patient explains that he woke up on the road, people were helping him.  Unsure if he had a seizure.  Endorsing left elbow pain, lower back pain.  Recently discharged from psychiatric hospital, was interested in detox from crystal meth.  Review of Systems  A thorough review of systems was obtained and all systems are negative except as noted in the HPI and PMH.   Patient's Health History    Past Medical History:  Diagnosis Date   Anxiety disorder    Chronic back pain    Chronic pain    Chronic shoulder pain    Drug-seeking behavior    Epilepsy    History of electroencephalogram 2013   [facility], "pseudoseizures"   Hyperlipidemia    Hypothyroidism    Malingering    Polysubstance abuse    Pseudoseizure    Seizure    Spine pain    Thyroid disease     History reviewed. No pertinent surgical history.  History reviewed. No pertinent family history.  Social History   Socioeconomic History   Marital status: Single    Spouse name: Not on file   Number of children: Not on file   Years of education: Not on file   Highest education level: Not on file  Occupational History   Not on file  Tobacco Use   Smoking status: Every Day    Packs/day: 1    Types: Cigarettes   Smokeless tobacco: Never  Vaping Use   Vaping Use: Never used  Substance and Sexual Activity   Alcohol use: Not Currently    Comment: prior to being incarcerated   Drug use: Never    Types: Cocaine, Methamphetamines    Comment: Crystal ice   Sexual activity: Yes  Other Topics Concern   Not on file  Social History Narrative   ** Merged History Encounter **       ** Merged History Encounter **        ** Merged History Encounter **       ** Merged History Encounter **       ** Merged History Encounter **       ** Merged History Encounter **       ** Merged History Encounter **       Social Determinants of Corporate investment banker Strain: Not on file  Food Insecurity: Not on file  Transportation Needs: Not on file  Physical Activity: Not on file  Stress: Not on file  Social Connections: Not on file  Intimate Partner Violence: Not on file     Physical Exam   Vitals:   10/27/22 0105 10/27/22 0125  BP:  124/85  Pulse:  95  Resp:  18  Temp:  98.5 F (36.9 C)  SpO2: 99% 99%    CONSTITUTIONAL: Chronically ill-appearing, NAD NEURO/PSYCH:  Alert and oriented x 3, no focal deficits EYES:  eyes equal and reactive ENT/NECK:  no LAD, no JVD CARDIO: Regular rate, well-perfused, normal S1 and S2 PULM:  CTAB no wheezing or rhonchi GI/GU:  non-distended, non-tender MSK/SPINE:  No gross deformities, no edema SKIN:  no rash, atraumatic   *Additional and/or  pertinent findings included in MDM below  Diagnostic and Interventional Summary    EKG Interpretation  Date/Time:    Ventricular Rate:    PR Interval:    QRS Duration:   QT Interval:    QTC Calculation:   R Axis:     Text Interpretation:         Labs Reviewed  CBC  COMPREHENSIVE METABOLIC PANEL    CT HEAD WO CONTRAST ( )    (Results Pending)  DG Elbow Complete Left    (Results Pending)  CT Lumbar Spine Wo Contrast    (Results Pending)    Medications  acetaminophen (TYLENOL) tablet 1,000 mg (1,000 mg Oral Patient Refused/Not Given 10/27/22 0157)     Procedures  /  Critical Care Procedures  ED Course and Medical Decision Making  Initial Impression and Ddx Found down by bystanders reportedly, he is not sure what happened.  He has normal vital signs, sitting comfortably, nontoxic, I see no evidence of head trauma, no increased work of breathing, clear lungs, no denies any chest pain.  No abdominal  pain or tenderness.  He is having low back pain, left elbow pain.  Differential diagnosis includes intracranial bleeding, syncope, electrolyte disturbance, seizure, fracture  Past medical/surgical history that increases complexity of ED encounter: Seizure disorder, polysubstance use disorder  Interpretation of Diagnostics Patient initially agreeable to testing but is now refusing all diagnostics because she was only offered Tylenol, not offered anything stronger.  Patient Reassessment and Ultimate Disposition/Management     Patient fully explained to the reasoning for the tests and the risks of not doing them, still he refuses.  Leaving AGAINST MEDICAL ADVICE.  2 AM update: Despite being fully informed of the disposition, patient became quite belligerent when it was time for him to be discharged AGAINST MEDICAL ADVICE.  He began screaming that he was both suicidal and homicidal.  He has a long history of malingering behavior according to chart review.  We will not allow him to abuse the system in this way.  He was offered a thorough medical evaluation and he refused it.    Patient management required discussion with the following services or consulting groups:  None  Complexity of Problems Addressed Acute illness or injury that poses threat of life of bodily function  Additional Data Reviewed and Analyzed Further history obtained from: Care Everywhere and Prior labs/imaging results  Additional Factors Impacting ED Encounter Risk None  Elmer Sow. Pilar Plate, MD [facility] Emergency Medicine [facility] [email or web address]  Final Clinical Impressions(s) / ED Diagnoses     ICD-10-CM   1. Left elbow pain  M25.522       ED Discharge Orders     None        Discharge Instructions Discussed with and Provided to Patient:    Discharge Instructions      You are leaving the emergency department AGAINST MEDICAL ADVICE.  You refused all testing.  Risks of leaving  include worsening of condition, death.  Please return to the emergency department at any time for continued care.      Sabas Sous, MD 10/27/22 1610    Sabas Sous, MD 10/27/22 [phone]

## 2022-10-27 NOTE — ED Triage Notes (Signed)
Per EMS patient found in a bank parking lot, requesting treatment for post seizure with fall with elbow pain only. Pt stated was seen at [facility] and request different facility.

## 2022-10-27 NOTE — Discharge Instructions (Signed)
You are leaving the emergency department AGAINST MEDICAL ADVICE.  You refused all testing.  Risks of leaving include worsening of condition, death.  Please return to the emergency department at any time for continued care.

## 2022-10-27 NOTE — ED Triage Notes (Signed)
Pt reports he was at [facility] for 24 hours, they got him a bed at [facility] to detox from "crystal ice". Pt reports [facility] kicked him out around 00:30, saying they didn't do detox. They wouldn't give him a cab voucher because he lived too far. Pt reports he started walking and woke up and 2 people were getting him out of the road. Pt states "I must have had a seizure". Pt also c/o left elbow pain and needs a ride home.

## 2022-10-31 ENCOUNTER — Other Ambulatory Visit: Payer: Self-pay

## 2022-10-31 ENCOUNTER — Emergency Department (HOSPITAL_BASED_OUTPATIENT_CLINIC_OR_DEPARTMENT_OTHER): Payer: Medicaid Other

## 2022-10-31 ENCOUNTER — Emergency Department (HOSPITAL_BASED_OUTPATIENT_CLINIC_OR_DEPARTMENT_OTHER)
Admission: EM | Admit: 2022-10-31 | Discharge: 2022-10-31 | Disposition: A | Discharge status: Home / Self Care | Payer: Medicaid Other | Attending: Emergency Medicine | Admitting: Emergency Medicine

## 2022-10-31 DIAGNOSIS — M25511 Pain in right shoulder: Secondary | ICD-10-CM | POA: Insufficient documentation

## 2022-10-31 DIAGNOSIS — G8929 Other chronic pain: Secondary | ICD-10-CM | POA: Diagnosis not present

## 2022-10-31 MED ORDER — HYDROMORPHONE HCL 1 MG/ML IJ SOLN: 2.0000 mg | Freq: Once | INTRAMUSCULAR | Status: DC

## 2022-10-31 MED ORDER — METHOCARBAMOL 1000 MG/10ML IJ SOLN: 1000.0000 mg | Freq: Once | INTRAMUSCULAR | Status: DC

## 2022-10-31 NOTE — ED Notes (Addendum)
Walked in pt room to start IV and administer pain meds , pt refuses IV start and requests IM pain meds and stated that he would put his shoulder back without sedation or assistance  Pt had his affected arm above his head and intact ROM . Pt is refusing X -ray unless he  gets his pain meds . EDP made aware , multiple staff members witnessed pt moving his affected arm freely and no obvious distress.

## 2022-10-31 NOTE — ED Provider Notes (Signed)
[name] Provider Note   CSN: [id number] Arrival date & time: 10/31/22  1522     History  Chief Complaint  Patient presents with   Shoulder Pain    [name] is a 47 y.o. male.  With PMH of polysubstance abuse, drug-seeking behavior, malingering and recurrent right shoulder complaints who complains of right shoulder pain and feeling like he dislocated his shoulder after moving a fridge earlier today.  He is requesting Dilaudid and pain medicine as he can normally pop the right back and after.  He is refusing any other medications.  He had no other injuries.  There is no loss sensation numbness or tingling or weakness.   Shoulder Pain      Home Medications Prior to Admission medications   Medication Sig Start Date End Date Taking? Authorizing Provider  acetaminophen (TYLENOL) 500 MG tablet Take 1 tablet (500 mg total) by mouth every 6 (six) hours as needed. Patient not taking: Reported on 04/07/2019 07/15/15   Lyndal Pulley, MD  amoxicillin (AMOXIL) 500 MG capsule Take 1 capsule (500 mg total) by mouth 3 (three) times daily. Patient not taking: Reported on 04/07/2019 08/12/18   Elson Areas, PA-C  diclofenac (VOLTAREN) 50 MG EC tablet Take 1 tablet (50 mg total) by mouth 2 (two) times daily. Patient not taking: Reported on 04/07/2019 08/12/18   Elson Areas, PA-C  ibuprofen (ADVIL) 200 MG tablet Take 800 mg by mouth every 6 (six) hours as needed for moderate pain.    [provider]  naproxen (NAPROSYN) 500 MG tablet Take 1 tablet (500 mg total) by mouth 2 (two) times daily as needed (pain). 08/19/22   Zenia Resides, MD  oxyCODONE (ROXICODONE) 5 MG immediate release tablet Take 1 tablet (5 mg total) by mouth every 6 (six) hours as needed for severe pain. 08/19/22   Zenia Resides, MD      Allergies    Chlorpromazine, Chlorpromazine hcl, Haloperidol, Haloperidol and related, Haloperidol and related, Iodinated  contrast media, Ketorolac, Ketorolac tromethamine, Ketorolac tromethamine, Toradol [ketorolac tromethamine], Iodine, Haldol [haloperidol], Tramadol, Haloperidol lactate, and Morphine    Review of Systems   Review of Systems  Physical Exam Updated Vital Signs BP (!) 151/94   Pulse (!) 105   Temp 98 F (36.7 C) (Oral)   Resp 20   Ht  (1.753 m)   Wt 127 kg   SpO2 97%   BMI 41.35 kg/m  Physical Exam Constitutional: Alert and oriented. Well appearing and in no distress. Eyes: Conjunctivae are normal. ENT      Head: Normocephalic and atraumatic. Cardiovascular: S1, S2, mildly tachycardic, palpable radial pulses bilaterally, upper extremities warm well perfused Respiratory: Normal respiratory effort.  Gastrointestinal: obese Musculoskeletal: Reported tenderness along right shoulder and scapula holding arm with a winged scapula however on repeat exam moving right upper extremity around head and across abdomen to other arm with no issues.  Palpable right upper extremity radial pulses.  Full grip strength intact.  No external evidence of injury. Neurologic: Normal speech and language. No gross focal neurologic deficits are appreciated. Skin: Skin is warm, dry and intact. No rash noted. Psychiatric: Mood and affect are normal. Speech and behavior are normal.  ED Results / Procedures / Treatments   Labs (all labs ordered are listed, but only abnormal results are displayed) Labs Reviewed - No data to display  EKG None  Radiology DG Shoulder Right  Result Date: 10/31/2022 CLINICAL  DATA:  Recurrent shoulder dislocation EXAM: RIGHT SHOULDER - 3 VIEW COMPARISON:  None Available. FINDINGS: No fracture or dislocation. Preserved bone mineralization. There is wing appearance of the scapula. Please correlate with history. This could be projectional. Additional workup as clinically appropriate IMPRESSION: No obvious fracture or dislocation but atypical orientation on these images with a winged  scapula. Please correlate with clinical history. Electronically Signed   By: Karen Kays M.D.   On: 10/31/2022 16:12    Procedures Procedures    Medications Ordered in ED Medications - No data to display  ED Course/ Medical Decision Making/ A&P Clinical Course as of 10/31/22 1711  Sun Oct 31, 2022  1619 Patient reassessed and examined after x-ray performed which showed no evidence of fracture or dislocation.  He is reaching over to the complete left arm showing no evidence of acute dislocation.  I am not concerned for active dislocation or fracture.  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  Offered patient Robaxin shot he is declining.  He continues to ask for narcotics.  I did offer Tylenol and other modes of pain control but did not think any requirement for narcotics at this time with no acute injury and chronic issue.  Offered patient sling he declined.  Patient discharged with orthopedic information for follow-up. [VB]    Clinical Course User Index [VB] Mardene Sayer, MD                             Medical Decision Making TYUS Horne is a 47 y.o. male.  With PMH of polysubstance abuse, drug-seeking behavior, malingering and recurrent right shoulder complaints who complains of right shoulder pain and feeling like he dislocated his shoulder after moving a fridge earlier today.  Since 2021, patient has had at least 25 visits I have counted from chart review to various Eds for recurrent right shoulder dislocation and complaints.  Every time he has visited, requests IM Dilaudid and will relocate his shoulder on his own.  This is consistent with chronic subluxed shoulder.  His arm is neurovascularly intact I am with no concern for ischemic injury.  He was complaining of same today.  On exam with room open patient was able to move right shoulder around in full range of motion but when I clinically examined him would refuse to move  right shoulder.  However x-ray was also obtained which showed no acute fracture or dislocation which I personally reviewed.  I explained to patient that there is no evidence of acute fracture or dislocation.  I offered patient IM Robaxin which he refused.  I offered patient shoulder sling and swath which she also refused.  Offered patient orthopedic information for follow-up which he refused.   Patient reassessed and examined after x-ray performed which showed no evidence of fracture or dislocation.  He is reaching over to the complete left arm showing no evidence of acute dislocation.  I am not concerned for active dislocation or fracture.  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  Offered patient Robaxin shot he is declining.  He continues to ask for narcotics.  I did offer Tylenol and other modes of pain control but did not think any requirement for narcotics at this time with no acute injury and chronic issue.  Offered patient sling he declined.  Patient discharged with orthopedic  information for follow-up. [VB]  Amount and/or Complexity of Data Reviewed Radiology: ordered.    Final Clinical Impression(s) / ED Diagnoses Final diagnoses:  Chronic right shoulder pain    Rx / DC Orders ED Discharge Orders     None         Mardene Sayer, MD 10/31/22 1711

## 2022-10-31 NOTE — ED Triage Notes (Signed)
Patient presents to ED via POV from home. Patient was moving a fridge today when he dislocated his right shoulder. Obvious deformity noted. 2+ radial pulse. Patient endorses tingling to right arm. Patient states this occurred 20 minutes PTA.

## 2022-10-31 NOTE — ED Notes (Signed)
Walked out of room , moving arms freely, refused sling and swathe. Noted to give "the finger" as walking by nurse

## 2022-10-31 NOTE — ED Notes (Signed)
Went with EDP into room to get him fitted for a sling and pt refused to take sling, and also refused the robaxin injection the EDP wanted to give to him.  Patient immediately hopped up out of the bed, got dressed up and walked out.

## 2022-10-31 NOTE — Discharge Instructions (Addendum)
You were seen in the Emergency Department for right shoulder pain. Your x-ray did not show any signs of an acute fracture or dislocation. Your pain and swelling is likely from a tendon issue. Take ibuprofen, as directed on the packaging, to reduce pain and inflammation at home.   To take care of your shoulder while you heal,  R- rest injury as needed, do not overexert yourself I - ice the affected area, 20 minutes every 4-6 hours, this will help with any swelling C- compress the affected area as needed to apply support and to decrease any swelling E- elevate frequently when in sitting or laying position.   Make an appointment with orthopedic to follow-up outpatient.  Return to the Emergency Department if you develop worsening pain, swelling, redness, warmth, numbness, loss of sensation, fever, chills, vomiting, or any other symptoms you find concerning.

## 2024-06-03 NOTE — ED Provider Notes (Signed)
 ------------------------------------------------------------------------------- Attestation signed by Alexander [place] Zirulnik, MD at 06/08/2024  9:35 AM ATTENDING SUPERVISORY NOTE I have personally seen and examined the patient, and discussed the plan of care with the resident.   I have reviewed the documentation of the resident and agree.   Patient's presentation is most consistent with acute presentation with potential threat to life or bodily function.  Alexander [place] Zirulnik, MD  -------------------------------------------------------------------------------  [facility] Emergency Department Physician Note Assumption of Care  Patient care assumed from previous provider.   Patient care of [name] is a 48 y.o. male from previous provider.  Please see the original provider note from this emergency department encounter for full history and physical.   Medical History[1] Surgical History[2]  Course of Care and my assessment at the time of sign out is detailed in the ED Course below.   ED Course as of 06/03/24 1536  Sun Jun 03, 2024  0700 S - 47yoM, hx hypothy heavy smoking, here for a lot of things, hood came down on head +LOC, HA since, then R arm swelling, bilateral leg swelling, per patient, sinus congestion, + CP,  - CTH negative - EF okay, IVC not plump, no b-lines, bnp ok, slight cardiomegaly on cxr - mallox, GI cocktail [ ]  D-dimer vs U/s - If d-dimer positive, admit for contrast allergy VQ scan [HG]    ED Course User Index [HG] Sherrilyn Almarie Prude, MD    Patient endorsed complete resolution of his chest pain, continued to endorse sinus congestion.  Patient queried whether this was due to his daily crystal methamphetamine use. I advised that he should try to quit because it may be causing all of his current symptoms.   D-dimer was negative. Ultrasound continues to be listed as arrived, however patient endorses that he received the  ultrasound approximately 2 hours ago.  I called radiology who reached out to the ultrasound tech and was unable to get in contact.  Seems that exam was not yet closed out.  While awaiting results, patient left the ED.  Given negative D-dimer, I do not feel that I have to call patient to come back at this time.  Will plan to call patient with ultrasound results if needed.   On review, U/S without evidence of DVT or venous obstruction. Feel patient can continue with outpatient follow-up for his symptoms.   1. Methamphetamine use   2. Chest pain, unspecified type   3. Localized swelling of right upper extremity      DISCHARGE: Patient is felt to be medically appropriate for discharge at this time. Patient was informed of all pertinent physical exam, laboratory, and imaging findings. Patients suspected etiology of their symptom presentation was discussed with the patient and all questions were addressed. Patient was instructed to follow up with their primary care doctor for re-evaluation. Patient was given strict return precautions. The patient agreed with the discharge plan and verbalized understanding of return precautions. The patient was assured that we would be happy to reevaluate them at anytime if they had any concerns about their health regardless of whether it was related to this visit; and, ultimately, discharged in stable condition.   ED Disposition     ED Disposition  Discharge   Condition  Stable   Comment  --          The plan for this patient was discussed with Dr. Marsa Kotyk Zirulnik who voiced agreement and who oversaw evaluation and treatment of this patient.  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

## 2024-06-03 NOTE — ED Provider Notes (Signed)
 ------------------------------------------------------------------------------- Attestation signed by Lonni Almyra Dulcy Nicholaus, MD at 06/09/2024  6:20 PM ATTENDING SUPERVISORY NOTE  I have personally viewed the imaging studies performed. I have personally seen and examined the patient, and discussed the plan of care with the resident.  I have reviewed the documentation of the resident and agree.  -------------------------------------------------------------------------------  [facility] Emergency Department Physician Note   Medical Decision Making   HPI/ROS:  [name] is a 48 y.o. male with history of hypothyroidism presenting with chief complaint of headache, right arm swelling, sinus congestion, and chest pain.  History of Present Illness This is a 48 year old male with a history of hypothyroidism presenting with right arm swelling, chest pain, headache, and sinus infection. He is accompanied by his wife.  The patient reports experiencing swelling in his right arm for the past 2 months. He also notes occasional swelling in his legs, particularly in the muscles at the back of his calf, and mild swelling in the front of his legs. He has not required new clothing due to tightness over the past few months. He has no history of thrombosis.  Two days ago, while working on his car, the hood fell and struck him on the head, causing him to briefly lose consciousness. Since then, he has been experiencing pain on the left side of his head, which was most intense tonight. He took 4 ibuprofens and 2 Tylenols about an hour ago without any relief.  The patient has been dealing with a sinus infection for approximately 5 to 6 months, which he believes has spread to his chest. He describes a sensation of emptiness and burning in his chest, akin to a large hole, and a similar feeling in the back of his throat. He reports no sore throat.  He denies fevers.  He reports no  symptoms of acid reflux or heartburn but experiences pain during respiration. He does not feel short of breath and reports no radiation of the pain. He also reports no associated nausea or vomiting. He has no history of cardiac or pulmonary issues.  SOCIAL HISTORY The patient smokes a pack a day.  FAMILY HISTORY His father had heart problems.    History obtained from patient, chart review, significant other  Medical Decision Making: Patient presenting as above with concern for headache after head injury with loss of consciousness, swelling, and chest pain.  Vital signs on presentation reassuring, within normal limits.  Physical exam overall unremarkable.  No appreciable upper extremity swelling.  Neurologic function within normal limits.  Equal and intact cranial nerves, equal and intact sensorimotor function of bilateral upper and lower extremities.  Small scalp abrasion.  Very mild bilateral lower extremity edema.  Differential diagnosis includes but is not limited to VTE, new onset CHF, pulmonary embolism, intracranial hemorrhage, calvarial fractures, etc.  Initial workup to include EKG, basic labs, BNP, troponins, chest x-ray.  CT head ordered to rule out calvarial fractures and intracranial injuries.  Given concern for right arm swelling, DVT ultrasound ordered.  Oxycodone  administered for pain.  EKG reviewed and interpreted by myself..  Normal sinus rhythm with no ischemic changes, ST changes, interval disturbances, or significant conduction blocks.  Overall reassuring.  Basic labs resulted with CMP with normal creatinine, electrolytes within normal limits.  CBC with no leukocytosis, hemoglobin of 12.3 from 13.7 3 weeks ago.  Patient denies any bleeding.  Troponins negative x 2.  BNP within normal limits.  Bedside cardiac ultrasound obtained, demonstrating qualitatively normal EF with no  pericardial effusions and no evidence of right heart strain.  IVC within normal limits.  On  reassessment, patient remains resting comfortably with stable vital signs.  Endorsing that he is experiencing some heartburn and acid reflux.  Given GI cocktail.   Disposition:  HANDOFF: This patient's work-up was incomplete and items were still pending at the time that I departed the emergency department.  The history, physical and clinical course in the ED were discussed with oncoming team for further workup. Please see their note for documentation of the continuation of patient's care and their disposition.   ED Disposition     ED Disposition  Discharge   Condition  Stable   Comment  --         The plan for this patient was discussed with the ED attending physician of record, who voiced agreement and who oversaw evaluation and treatment of this patient.    Due to the patients current presenting symptoms, physical exam findings, and the workup stated above, it is thought that the etiology of the patients current presentation is:  1. Methamphetamine use   2. Chest pain, unspecified type   3. Localized swelling of right upper extremity      Clinical Complexity A medically appropriate history, review of systems, and physical exam was performed.  Patient's presentation is most consistent with acute presentation with potential threat to life or bodily function  This record was generated with the aid of voice dictation software, and may contain errors. Please contact me for any clarification or with any questions.    HPI/ROS      See MDM  Medical History[1]  Surgical History[2]    Physical Exam   Vitals:   06/03/24 0545 06/03/24 0615 06/03/24 0645 06/03/24 0945  BP: (!) 112/57 126/88 116/72 111/62  BP Location:      Patient Position:      Pulse: 97 92 93 86  Resp: 20 20 20 18   Temp:      SpO2: 96% 94% 95% 99%    Physical Exam Vitals reviewed.  Constitutional:      General: He is not in acute distress.    Appearance: Normal appearance. He is normal weight. He  is not ill-appearing.  HENT:     Head: Normocephalic. Abrasion present.  Cardiovascular:     Rate and Rhythm: Normal rate and regular rhythm.  Pulmonary:     Effort: Pulmonary effort is normal.  Abdominal:     General: Abdomen is flat. There is no distension.     Palpations: Abdomen is soft.     Tenderness: There is no abdominal tenderness.  Musculoskeletal:     Right lower leg: Edema present.     Left lower leg: Edema present.  Skin:    General: Skin is warm and dry.  Neurological:     General: No focal deficit present.     Mental Status: He is alert and oriented to person, place, and time. Mental status is at baseline.  Psychiatric:        Mood and Affect: Mood normal.        Behavior: Behavior normal.     Will Laurenzo, MD Department of Emergency Medicine   Please note that this documentation was produced with the assistance of voice-to-text technology and may contain errors.          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

## 2024-06-08 NOTE — ED Provider Notes (Signed)
 ------------------------------------------------------------------------------- Attestation signed by Mabel Lemond Lawn, MD at 06/09/2024  4:43 AM I supervised the resident caring for this patient. I assessed the patient at bedside and obtained a history and physical examination. I have reviewed the patient's medical record including laboratory and imaging results. I have reviewed the resident's documentation, and agree with their assessment and plan.  Patient's presentation is most consistent with acute presentation with potential threat to life or bodily function.  Mabel Lawn, MD -------------------------------------------------------------------------------  Subjective  [name] is a 48 y.o. 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  Approximately one week ago, the patient sustained a head injury when a car hood fell on him. He has been experiencing persistent pain in the left side of his head since the incident, which he rates as 8 or 9 out of 10 in severity. He also reports muscle twitching and a temporary decrease in visual acuity after hitting his head, necessitating close proximity to read text on the television screen. His vision has since returned to baseline. He is not experiencing any double vision, nausea, or vomiting. A CT scan was performed last week, revealing no ICH.  The patient has been experiencing chest pain for months, which has led to multiple hospital visits. Initial blood work indicated elevated cardiac markers, but subsequent tests were within normal limits. An ultrasound of his heart was also performed, yielding normal results. He describes a sensation of hollowness in his abdomen, with pain radiating upwards into his chest. He was previously diagnosed with acid reflux and  prescribed medication, which did not alleviate his symptoms. He consumes a significant amount of spicy food and has a history of stomach infection, which required antibiotic treatment. Also takes daily meloxicam.  The patient has been experiencing arm swelling for approximately one month, which he reports is progressively worsening. He describes the sensation as akin to a water balloon. He has no history of blood clots. He occasionally experiences fevers and has a history of sinus infection. He has undergone an ultrasound and CT scan of his arm, both of which were negative for blood clots. He also reports difficulty urinating, describing it as challenging to initiate the stream. He has a history of minor trauma to the arm, resulting in a small bump and bruise.  The patient has a history of sinus pain, which has been ongoing for about 6 months. He was given antibiotics by a local hospital about a month ago. He reports soreness and knots in the area, with yellow and green discharge from his eyes. Referral placed to ENT last week.  The patient smokes cigarettes and was previously prescribed an inhaler, which caused headaches so he does not use it. He does not consistently experience wheezing.  SOCIAL HISTORY Denies alcohol.  Endorses smoking.  Denies drug use, although on last presentation to the ED last week patient endorsed daily methamphetamine use.  Objective  Blood pressure 128/81, pulse 108, temperature 97.9 F (36.6 C), temperature source Oral, resp. rate 16, height 175.3 cm (5' 9), weight 127 kg (280 lb), SpO2 100%. Physical Exam Vitals reviewed.  Constitutional:      General: He is not in acute distress.    Appearance: He is not toxic-appearing or diaphoretic.  HENT:     Head: Normocephalic.     Comments: 1cm well-healing closed laceration  on parietal scalp Eyes:     General:        Right eye: No discharge.     Extraocular Movements: Extraocular movements intact.      Conjunctiva/sclera: Conjunctivae normal.     Pupils: Pupils are equal, round, and reactive to light.  Neck:     Vascular: No JVD.  Cardiovascular:     Rate and Rhythm: Regular rhythm. Tachycardia present.     Pulses:          Radial pulses are 2+ on the right side and 2+ on the left side.       Dorsalis pedis pulses are 2+ on the right side and 2+ on the left side.     Heart sounds: Normal heart sounds. No murmur heard.    No friction rub. No gallop.  Pulmonary:     Effort: Pulmonary effort is normal. No tachypnea or respiratory distress.     Breath sounds: No stridor. Wheezing (faint expiratory, bilateral lung bases) present. No rhonchi or rales.  Chest:     Chest wall: No deformity or tenderness.  Abdominal:     General: Abdomen is flat.     Palpations: Abdomen is soft. There is no mass.     Tenderness: There is abdominal tenderness in the right upper quadrant and epigastric area. There is guarding. There is no right CVA tenderness, left CVA tenderness or rebound. Negative signs include Murphy's sign, Rovsing's sign and McBurney's sign.  Musculoskeletal:     Cervical back: Normal range of motion and neck supple.     Right lower leg: No tenderness.     Left lower leg: No tenderness.     Comments: Trace edema all extremities, right wrist and forearm greater than others. Neurovascularly intact, no warmth, erythema, or tenderness.  Skin:    General: Skin is warm and dry.     Capillary Refill: Capillary refill takes less than 2 seconds.  Neurological:     General: No focal deficit present.     Mental Status: He is alert and oriented to person, place, and time.  Psychiatric:        Mood and Affect: Mood is anxious.        Behavior: Behavior normal.     Assessment & Plan  ED Course: ED Course as of 06/09/24 0324  Sat Jun 09, 2024  0323 Pt refused 2nd trop. HEART score 3 [TL]    ED Course User Index [TL] Adolphus Lorane Double, MD    MDM Patient presents for variety of  symptoms, which have been evaluated during prior ED visits including last week, during which patient had negative DVT study of right arm, negative CT head, negative XR chest as well as labs including D-dimer, troponin, BNP, CMP, CBC, no concerning etiology for symptoms found.  Presentation is most consistent with GERD / gastritis in setting of daily NSAID use and frequent consumption of spicy food. Also considered peptic ulcer disease. Considered ACS. The ECG reveals no anatomical ischemia representing STEMI, new-onset arrhythmia, or ischemic equivalent. Patient has been risk stratified with a HEAR score of 3.  0-hour troponin is neg; 3-hour troponin was refused by patient. Absence of positional pain, recent illness, PR depressions, ST elevations, elevated troponin reassuring against pericarditis and myocarditis. There is no fever, cough, or focal infiltrate on CXR to suggest pneumonia. CXR and exam do not suggest pneumothorax. There is no history of forceful vomiting to suggest esophageal perforation. Considered aortic dissection however pain is not tearing, does  not radiate to the back. There is no widened mediastinum on CXR or pulse deficit on exam. Considered PE however patient is not hypoxic and does not have suggestive exam findings or risk factors including recent immobility, long distance travel, surgery, known malignancy, hormone use, prior DVT or PE.  Patient does have some swelling of right arm as described, however on evaluation of this last week, D-dimer was negative.  Doubt DVT as this is unchanged since dimer is negative, and patient has mild swelling noted in all extremities.  Patient has neurovascularly intact in right arm, no deformity or history of significant trauma, exam not consistent with cellulitis or necrotizing fasciitis.  Patient has headache with negative CT head from 1 week ago following hitting it on car hood.  No focal neurologic deficits, red flag features, will not obtain repeat  scan at this time.  Appropriate for follow-up for possible concussion with PCP.  Patient does not have leukocytosis.  Hgb 13.2.  Lipase WNL.  No significant electrolyte abnormalities or metabolic changes.  No AKI.  CT abdomen pelvis obtained (without contrast due to anaphylactic allergy). No acute findings.   Patient given Maalox and viscous lidocaine  with improvement in pain.  Requesting additional pain medication for headache, given Tylenol .  Patient refused Tylenol .  Advised to start taking Pepcid and avoid NSAIDs, spicy food, caffeine, EtOH.  Advised to follow-up with PCP.   The plan for this patient was discussed with Dr. Lorenso, who voiced agreement and who oversaw evaluation and treatment of this patient.    DISCHARGE: Patient is felt to be medically appropriate for discharge at this time. Patient's family was informed of all pertinent physical exam, laboratory, and imaging findings. Patient's suspected etiology of their symptom presentation was discussed with the patient's family and all questions were answered. They were instructed to have the patient follow up closely with their primary care doctor for re-evaluation. They were given strict return precautions.    Due to the patients current presenting symptoms, physical exam findings, and the workup stated above, Impression is:  1. Epigastric pain      2. Arm swelling      3. Acute nonintractable headache, unspecified headache type      4. Chest pain, unspecified type        ED Disposition     ED Disposition  Discharge   Condition  Stable   Comment  --         New Medications Ordered This Visit  Medications   alum-mag hydroxide-simethicone (MAALOX, MYLANTA) 200-200-20 mg/5 mL suspension 30 mL   lidocaine  (XYLOCAINE ) 2 % viscous solution 5 mL   famotidine (PEPCID) 20 mg tablet    Sig: Take 1 tablet (20 mg total) by mouth 2 (two) times a day for 15 days.   acetaminophen  (TYLENOL ) tablet 1,000 mg    Attestation

## 2024-06-16 NOTE — Nursing Note (Signed)
 Patient left prior to sling application, pain assessment and discharge instructions.

## 2024-06-16 NOTE — ED Provider Notes (Signed)
 "                                                                                   Emergency Department Provider Note    ED Clinical Impression   Final diagnoses:  Right shoulder pain, unspecified chronicity (Primary)    ED Assessment/Plan    Condition: Stable Disposition: Discharge  This chart has been completed using Dragon Medical Dictation software, and while attempts have been made to ensure accuracy, certain words and phrases may not be transcribed as intended.   History   Chief Complaint  Patient presents with   Shoulder Pain   Fall    Shoulder Pain  Fall    [name] is a 48 y.o. male with history of methamphetamine use, bipolar disorder, seizure disorder, anxiety to the emergency department for evaluation after fall.  Patient reports that he noticed his forearms were swelling within the last few hours.  He got nervous after seeing this and reportedly fell injuring his right shoulder.  Patient reports that he has dislocated his shoulder multiple times, feels like it is out of place again.  He denies head injury or loss of consciousness.    Allergies: is allergic to chlorpromazine, chlorpromazine, haloperidol, haloperidol, iodinated contrast media, iodinated contrast media, ketorolac , ketorolac , ketorolac , thorazine [chlorpromazine], toradol  [ketorolac ], tuna oil, iodine, tramadol , haldol [haloperidol lactate], and morphine. Medications: has a current medication list which includes the following long-term medication(s): albuterol . PMHx:  has a past medical history of Anxiety, Back pain, Bipolar 1 disorder    (CMS-HCC), Disease of thyroid gland, GERD (gastroesophageal reflux disease), Migraine, and Seizures    (CMS-HCC). PSHx:  has a past surgical history that includes Leg Surgery; Cholecystectomy; and pr cystoscopy,dil urethral stricture (N/A, 03/07/2018). SocHx:  reports that he has been smoking. He has never used smokeless tobacco. He reports that he  does not drink alcohol and does not use drugs. Allergies, Medications, Medical, Surgical, and Social History were reviewed as documented above.   Social Drivers of Health with Concerns   Food Insecurity: Not on file  Tobacco Use: High Risk (07/21/2023)   Received from [facility]   Patient History    Smoking Tobacco Use: Every Day    Smokeless Tobacco Use: Never    Passive Exposure: Not on file  Transportation Needs: Not on file  Alcohol Use: Not on file  Housing: Not on file  Physical Activity: Not on file  Utilities: Not on file  Stress: Not on file  Interpersonal Safety: Not on file  Substance Use: Not on file (05/22/2023)  Intimate Partner Violence: Not on file  Social Connections: Not on file  Financial Resource Strain: Not on file  Health Literacy: Not on file  Internet Connectivity: Not on file     Review Of Systems  Review of Systems  Physical Exam   BP 152/94   Pulse 106   Temp 36.6 C (97.9 F) (Temporal)   Resp 20   Ht 175.3 cm (5' 9)   Wt (!) 116.6 kg (257 lb)   SpO2 100%   BMI 37.95 kg/m   Physical Exam Constitutional:      General: He is not in  acute distress.    Appearance: Normal appearance. He is not ill-appearing, toxic-appearing or diaphoretic.  HENT:     Mouth/Throat:     Mouth: Mucous membranes are moist.     Pharynx: Oropharynx is clear.  Eyes:     Extraocular Movements: Extraocular movements intact.     Conjunctiva/sclera: Conjunctivae normal.  Pulmonary:     Effort: Pulmonary effort is normal. No respiratory distress.     Breath sounds: Normal breath sounds.  Abdominal:     General: There is no distension.     Palpations: Abdomen is soft.  Musculoskeletal:     Cervical back: Normal range of motion.     Comments: Patient holding right upper extremity and guarded position with 90 degree flexion at the elbow, scapula appears prominent but no obvious dislocation or bony deformity otherwise, right clavicle without skin tenting or  step-off deformity, motion of the elbow and digits intact, Premasol throughout, radial pulse 2+, questional minimal swelling noted to the proximal bilateral forearms  Skin:    General: Skin is warm and dry.  Neurological:     Mental Status: He is alert and oriented to person, place, and time.  Psychiatric:        Mood and Affect: Mood normal.        Behavior: Behavior normal.     ED Course  Medical Decision Making DDx: Fall, soft tissue injury/contusion, hematoma, sprain, strain, fracture/dislocation  Afebrile, nontoxic-appearing 48 year-old male presents to the emergency department for evaluation after fall.  Initial vitals show blood pressure 152/94, temperature 97.9, heart rate 1006, 100% on room air. Physical exam notable for prominent right scapula, guarding of the right shoulder but no obvious bony deformity or obvious signs of injury.  Compartments are throughout right upper extremity, radial pulse 2+, questionable minimal swelling to the proximal bilateral forearms without overlying rashes, bilateral radial pulse 2+. Patient is overall non-toxic, alert and oriented, GCS 15.  Patient did have DVT study of the right upper extremity which was negative, it appears that patient has been seen multiple times, 4 times last month across multiple different emergency departments for similar complaints of arm swelling on multiple different occasions upon review of EMR from outside facilities.  Review of EMR also notes that patient apparently is able to sublux his right shoulder and does this repeatedly, reportedly uses crystal meth daily for at least the last 3 years according to other notes.  Notes from last year from outside facility notable for drug-seeking behavior and malingering, recurrent right shoulder complaints where patient was reportedly requesting Dilaudid , refusing all other medicines.  Plain films unremarkable for acute osseous abnormalities.  On subsequent evaluation, patient  reportedly stated that his shoulder felt like it went back into socket right before the x-ray.  Will recommend follow-up with orthopedics, will give sling for comfort.  Patient stable for discharge.  Amount and/or Complexity of Data Reviewed Radiology: ordered and independent interpretation performed. Decision-making details documented in ED Course.  Risk Prescription drug management.     Procedures   No results found for this visit on 06/16/24 (from the past 4464 hours).      ED Results No results found for any visits on 06/16/24. XR Shoulder 3 Or More Views Right Result Date: 06/16/2024 Exam: Right shoulder radiographs, 3 views  History: Shoulder pain.  Technique: AP views in internal and external rotation  as well as a transcapular Y view right shoulder were performed.  Comparison: August 01, 2021  Findings: The bones are  well mineralized.  There is no acute fracture, malalignment or dislocation of the right shoulder. The acromioclavicular joint is not widened.  The coracoclavicular distance is normal.  The soft tissues are unremarkable. Incidentally imaged hemithorax is without acute finding.    No acute abnormality of the right shoulder.  Signed (Electronic Signature): 06/16/2024 6:56 PM Signed By: Ellouise Platt, MD   Medications Administered:  Medications  oxyCODONE  (ROXICODONE ) immediate release tablet 5 mg (5 mg Oral Given 06/16/24 1843)    Discharge Medications (Medications Prescribed during this  ED visit and Patient's Home Medications) :    Your Medication List     ASK your doctor about these medications    acetaminophen  500 MG tablet Commonly known as: TYLENOL  Take 500 mg by mouth Three (3) times a day.   albuterol  90 mcg/actuation inhaler Commonly known as: PROVENTIL  HFA;VENTOLIN  HFA Inhale 2 puffs every six (6) hours as needed.   atomoxetine 25 MG capsule Commonly known as: STRATTERA Take 25 mg by mouth daily.   azithromycin 250 MG tablet Commonly known  as: ZITHROMAX Z-PAK Take 2 tablets (500 mg) on  Day 1,  followed by 1 tablet (250 mg) once daily on Days 2 through 5.   codeine-guaiFENesin 10-100 mg/5 mL liquid Commonly known as: guaiFENesin AC Take 5 mL by mouth Three (3) times a day as needed for cough.   divalproex  ER 500 MG extended released 24 hr tablet Commonly known as: DEPAKOTE  ER Take 500 mg by mouth every morning. With crackers   fish oil-omega-3 fatty acids 300-1,000 mg capsule Take 1,000 mg by mouth daily.   gemfibrozil 600 MG tablet Commonly known as: LOPID Take 600 mg by mouth Two (2) times a day (30 minutes before a meal).   hydrOXYzine 25 MG capsule Commonly known as: VISTARIL Take 25 mg by mouth every evening.   levothyroxine  150 MCG tablet Commonly known as: SYNTHROID  Take 150 mcg by mouth daily.   methocarbamol  500 MG tablet Commonly known as: ROBAXIN  Take 1,000 mg by mouth Four (4) times a day.   predniSONE  10 MG tablet Commonly known as: DELTASONE  Take 4 po qd x 2d then 3 po qd x 2d then 2 po qd x 2d then 1 po qd x 2d then stop          Kopel, Andrew Lee, PA 06/16/24 1901  "

## 2024-07-24 NOTE — ED Provider Notes (Addendum)
 [facility]  Emergency Department Provider Note     History   Chief Complaint Bilateral arm pain chest pain   HPI  [name] is a 49 y.o. male comes into the complaining of bilateral forearm pain bilateral chest pain.  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  He has underlying history of anxiety disorder back pain bipolar disorder.  Patient does not seem to be in any distress.  He refused doing an EKG on him because he says I know it is not my heart.  He is notified the risks and benefits in detail layman's language and is refusing the EKG.  Understands that we cannot catch heart attack or prevent any death from heart attack or from arrhythmia or abnormal heart rhythm and this was explained to him in detail layman's language.    Past Medical History: No date: Anxiety No date: Back pain No date: Bipolar 1 disorder    (CMS-HCC) No date: Disease of thyroid gland No date: GERD (gastroesophageal reflux disease) No date: Migraine No date: Seizures    (CMS-HCC)  Past Surgical History: No date: CHOLECYSTECTOMY No date: LEG SURGERY 03/07/2018: PR CYSTOSCOPY,DIL URETHRAL STRICTURE; N/A     Comment:  Procedure: CYSTOURETHROSCOPY, W/CALIBRATE &/OR DILATE               URETH STRICT/STENOSIS, W/WO MEATOTOMY, W/WO INJ PROC               CYSTOGRAPHY;  Surgeon: Clorinda Ole Chalk, MD;                Location: [facility];  Service: Urology  Prior to Admission medications  Medication Dose, Route, Frequency  acetaminophen  (TYLENOL ) 500 MG tablet 500 mg, 3 times a day (standard) Patient not taking: Reported on 06/13/2020  albuterol  HFA 90 mcg/actuation inhaler 2 puffs, Inhalation, Every 6 hours PRN  atomoxetine (STRATTERA) 25 MG capsule 25 mg, Daily (standard) Patient not taking: Reported on 06/13/2020  azithromycin (ZITHROMAX Z-PAK) 250 MG tablet Take 2 tablets (500 mg) on  Day 1,  followed by 1 tablet (250 mg) once daily on Days 2  through 5.  codeine-guaifenesin (GUAIFENESIN AC) 10-100 mg/5 mL liquid 5 mL, Oral, 3 times a day PRN  divalproex  ER (DEPAKOTE  ER) 500 MG extended released 24 hr tablet 500 mg, Oral, Every morning, With crackers   gemfibrozil (LOPID) 600 MG tablet 600 mg, 2 times a day [facility]) Patient not taking: Reported on 06/13/2020  hydrOXYzine (VISTARIL) 25 MG capsule 25 mg, Every evening Patient not taking: Reported on 06/13/2020  levothyroxine  (SYNTHROID , LEVOTHROID) 150 MCG tablet 150 mcg, Oral, Daily (standard)  methocarbamol  (ROBAXIN ) 500 MG tablet 1,000 mg, 4 times a day Patient not taking: Reported on 06/13/2020  omega-3/dha/epa/fish oil (FISH OIL-OMEGA-3 FATTY ACIDS) 300-1,000 mg capsule 1,000 mg, Oral, Daily (standard)  predniSONE  (DELTASONE ) 10 MG tablet Take 4 po qd x 2d then 3 po qd x 2d then 2 po qd x 2d then 1 po qd x 2d then stop    Allergies Chlorpromazine, Chlorpromazine, Haloperidol, Haloperidol, Iodinated contrast media, Iodinated contrast media, Ketorolac , Ketorolac , Ketorolac , Thorazine [chlorpromazine], Toradol  [ketorolac ], Tuna oil, Iodine, Tramadol , Haldol [haloperidol lactate], and Morphine   Short Social History[1]  Review of Systems  Cardiovascular:  Positive for chest pain.  Musculoskeletal:  Positive for myalgias.  All other systems reviewed and are negative.   As in HPI, all systems reviewed and otherwise negative.  Physical Exam    There were no  vitals filed for this visit.   Physical Exam  Constitutional: Patient appears well-developed and well nourished. Non toxic in appearance. HEENT: Unremarkable. Head: Atraumatic.  Eyes: Normal ocular movements. Neck: Supple with normal range of motion.  Pulmonary/Chest: Effort normal. No respiratory distress.  Positive reproducible chest wall pain. Abdominal: Soft and non tender abdomen. Musculoskeletal: Extremities atraumatic.  Positive tenderness on palpation or deep palpation of bilateral forearms no evidence of any  compartment syndrome pulses are 2+ bilateral femoral popliteal dorsalis pedis posterior tibial as well as bilateral axilla antecubital and wrist bilaterally equal and symmetrical.  Cap refill less than 2 seconds at all digits bilaterally equal and symmetrical. Neurological: Alert with no focal neurological deficit. Ambulatory with a steady gait. Skin: Warm and dry.  Nursing note and vital signs reviewed.   ED Course        Procedures  Medications ordered during this encounter  Medications   sodium chloride  0.9% (NS) bolus 1,000 mL   baclofen (LIORESAL) tablet 10 mg   LORazepam  (ATIVAN ) tablet 1 mg    ED Results No results found for any visits on 07/24/24.  Radiology No results found.   Medical Decision Making   I have reviewed the vital signs and the nursing notes. Labs and radiology results that were available during my care of the patient were independently reviewed by me and considered in my medical decision making.    This is a 49 year old male who has been moving and shoving tires for the past couple of days with bilateral forearm pain and chest discomfort.  He is tender and has reproducible localized tenderness physical examination.  Medical Decision Making Amount and/or Complexity of Data Reviewed Labs: ordered. Radiology: ordered. ECG/medicine tests: ordered.  Risk Prescription drug management.     Differential Diagnosis: Musculoskeletal pain, muscle spasms, chest wall pain  -year-old male with This is a 48 history of Polar disorder who is coming in with bilateral forearm pain and chest discomfort and on examination has reproducible localized tenderness of both forearms as well as chest.  He is refusing any workup including EKG and blood test.  Patient stands risks including the differential diagnosis of missing on rhabdomyolysis explained to him in layman's language as well as heart attack arrhythmias irregular heart rhythm.  Patient understands these risks  in detail and in layman's language and is refusing all care.  Will be discharged with AMA and follow-up with his primary care physician. Later on about maybe 30-40 minutes later patient changes mind and then wanted to get some labs done. ED Clinical Impression   Final diagnoses:  None   Diffuse muscle spasms Myalgia Musculoskeletal pain of the chest wall  Procedures      This record has been created using [employer]. Chart creation errors have been sought, but may not always have been located. Such creation errors do not reflect on the standard of medical care.    Maree Jonelle Lash, MD 07/24/24 [phone]       [1] Social History Tobacco Use   Smoking status: Every Day   Smokeless tobacco: Never  Vaping Use   Vaping status: Never Used  Substance Use Topics   Alcohol use: Never   Drug use: Never    Comment: Pt states he has been clean for 1 1/2 years.    Maree Jonelle Lash, MD 07/24/24 1051

## 2024-07-26 NOTE — ED Notes (Signed)
 Radiology Tech at bedside.   [name] [facility] 07/26/24 1107

## 2024-07-26 NOTE — ED Notes (Signed)
 Patient Advocate introduced self to the patient and support person. The patient is requesting pain medication; RN made aware.   Andres Gunner [facility] 07/26/24 1109

## 2024-07-26 NOTE — ED Notes (Signed)
 No contrast allergy per Elsie Lebron Coffee, MD. Pt to be scanned with no pre med and allergy has been taken out of pt chart per Elsie Coffee, MD.

## 2024-07-26 NOTE — ED Notes (Signed)
 Patient Advocate rounded on this patient. The room light is off, curtain closed, and door shut. Advocate did not disturb.   Andres Gunner [facility] 07/26/24 1413

## 2024-07-27 ENCOUNTER — Other Ambulatory Visit: Payer: Self-pay

## 2024-07-27 ENCOUNTER — Encounter (HOSPITAL_COMMUNITY): Payer: Self-pay

## 2024-07-27 ENCOUNTER — Emergency Department (HOSPITAL_COMMUNITY)
Admission: EM | Admit: 2024-07-27 | Discharge: 2024-07-27 | Discharge status: Against Medical Advice | Payer: MEDICAID | Attending: Emergency Medicine | Admitting: Emergency Medicine

## 2024-07-27 DIAGNOSIS — M7989 Other specified soft tissue disorders: Secondary | ICD-10-CM | POA: Insufficient documentation

## 2024-07-27 DIAGNOSIS — R0602 Shortness of breath: Secondary | ICD-10-CM | POA: Diagnosis not present

## 2024-07-27 DIAGNOSIS — M79601 Pain in right arm: Secondary | ICD-10-CM | POA: Diagnosis present

## 2024-07-27 DIAGNOSIS — Z5329 Procedure and treatment not carried out because of patient's decision for other reasons: Secondary | ICD-10-CM | POA: Insufficient documentation

## 2024-07-27 DIAGNOSIS — R079 Chest pain, unspecified: Secondary | ICD-10-CM | POA: Diagnosis not present

## 2024-07-27 MED ORDER — OXYCODONE HCL 5 MG PO TABS
5.0000 mg | ORAL_TABLET | ORAL | Status: AC
  Administered 2024-07-27: 5 mg via ORAL
  Filled 2024-07-27: qty 1

## 2024-07-27 MED ORDER — ASPIRIN 81 MG PO CHEW
162.0000 mg | CHEWABLE_TABLET | Freq: Once | ORAL | Status: AC
  Administered 2024-07-27: 162 mg via ORAL
  Filled 2024-07-27: qty 2

## 2024-07-27 NOTE — ED Triage Notes (Signed)
 He states his right arm is swollen and he feels like its going to bust. This started about a hour ago. 2+ radial pulse. States he this has happened before and he had a blood clot in the same arm. He is saying he is having shortness of breath and chest pain as well.

## 2024-07-27 NOTE — ED Notes (Addendum)
"  Pt refused xray  "

## 2024-07-27 NOTE — ED Notes (Signed)
Pt seen leaving the emergency room.

## 2024-07-27 NOTE — ED Provider Triage Note (Signed)
 Emergency Medicine Provider Triage Evaluation Note  [name] , a 49 y.o. male  was evaluated in triage.  Pt complains of chest pain 1 hour PTA. Also with forearm pain. Has been seen for this before. Hx of methamphetamine use.  Review of Systems  Positive:  Negative:   Physical Exam  BP (!) 140/82 (BP Location: Right Wrist)   Pulse 100   Temp 97.7 F (36.5 C)   Resp (!) 24   Ht 5' 9 (1.753 m)   Wt 122.5 kg   SpO2 98%   BMI 39.87 kg/m  Gen:   Awake, no distress  Resp:  Normal effort  MSK:   Moves extremities without difficulty  Other:  Compartments of the arm soft. Radial pulses 2+ bilaterally  Medical Decision Making  Medically screening exam initiated at 6:11 PM.  Appropriate orders placed.  Zechariah R Tacker was informed that the remainder of the evaluation will be completed by another provider, this initial triage assessment does not replace that evaluation, and the importance of remaining in the ED until their evaluation is complete.   Yolande Lamar BROCKS, MD 07/27/24 [phone]

## 2024-07-27 NOTE — ED Provider Notes (Signed)
 " [name] EMERGENCY DEPARTMENT AT [facility] Provider Note   CSN: [id number] Arrival date & time: 07/27/24  1705     Patient presents with: Arm Pain, Shortness of Breath, and Chest Pain   [name] is a 49 y.o. male with a past medical history of methamphetamine use, bipolar disorder, seizure disorder who presents today for evaluation of arm swelling.  Patient reports that he has developed right forearm swelling that started earlier today.  On further questioning patient also describes swelling involving his left forearm as well as bilateral calfs.  Has also had associated chest pain as well as shortness of breath.  Denies any recent trauma to the area.  Endorses ongoing methamphetamine use but no IV drug use.  Without any fevers or chills.   Arm Pain Associated symptoms include chest pain and shortness of breath.  Shortness of Breath Associated symptoms: chest pain   Chest Pain Associated symptoms: shortness of breath        Prior to Admission medications  Medication Sig Start Date End Date Taking? Authorizing Provider  acetaminophen  (TYLENOL ) 500 MG tablet Take 1 tablet (500 mg total) by mouth every 6 (six) hours as needed. Patient not taking: Reported on 04/07/2019 07/15/15   Caye Sieving, MD  amoxicillin  (AMOXIL ) 500 MG capsule Take 1 capsule (500 mg total) by mouth 3 (three) times daily. Patient not taking: Reported on 04/07/2019 08/12/18   Flint Sonny POUR, PA-C  diclofenac  (VOLTAREN ) 50 MG EC tablet Take 1 tablet (50 mg total) by mouth 2 (two) times daily. Patient not taking: Reported on 04/07/2019 08/12/18   Sofia, Leslie K, PA-C  ibuprofen  (ADVIL ) 200 MG tablet Take 800 mg by mouth every 6 (six) hours as needed for moderate pain.    [provider]  naproxen  (NAPROSYN ) 500 MG tablet Take 1 tablet (500 mg total) by mouth 2 (two) times daily as needed (pain). 08/19/22   Vonna Sharlet POUR, MD  oxyCODONE  (ROXICODONE ) 5 MG immediate release tablet Take 1 tablet  (5 mg total) by mouth every 6 (six) hours as needed for severe pain. 08/19/22   Vonna Sharlet POUR, MD    Allergies: Chlorpromazine, Chlorpromazine hcl, Haloperidol, Haloperidol and related, Haloperidol and related, Iodinated contrast media, Ketorolac , Ketorolac  tromethamine , Ketorolac  tromethamine , Toradol  [ketorolac  tromethamine ], Iodine, Haldol [haloperidol], Tramadol , Haloperidol lactate, and Morphine    Review of Systems  Respiratory:  Positive for shortness of breath.   Cardiovascular:  Positive for chest pain.    Updated Vital Signs BP (!) 140/82 (BP Location: Right Wrist)   Pulse 100   Temp 97.7 F (36.5 C)   Resp (!) 24   Ht 5' 9 (1.753 m)   Wt 122.5 kg   SpO2 98%   BMI 39.87 kg/m   Physical Exam Constitutional:      Appearance: He is well-developed.  HENT:     Head: Normocephalic.  Eyes:     Pupils: Pupils are equal, round, and reactive to light.  Cardiovascular:     Rate and Rhythm: Normal rate and regular rhythm.  Pulmonary:     Effort: Pulmonary effort is normal.     Breath sounds: No decreased breath sounds.  Abdominal:     Palpations: Abdomen is soft.  Musculoskeletal:        General: Normal range of motion.     Cervical back: Normal range of motion.     Right lower leg: No edema.     Left lower leg: No edema.  Skin:  General: Skin is warm.     Capillary Refill: Capillary refill takes less than 2 seconds.  Neurological:     General: No focal deficit present.     Mental Status: He is alert.  Psychiatric:        Mood and Affect: Mood normal.     (all labs ordered are listed, but only abnormal results are displayed) Labs Reviewed  BASIC METABOLIC PANEL WITH GFR  CBC  D-DIMER, QUANTITATIVE  CK  TROPONIN T, HIGH SENSITIVITY    EKG: EKG Interpretation Date/Time:  Friday July 27 2024 17:43:54 EST Ventricular Rate:  107 PR Interval:  162 QRS Duration:  80 QT Interval:  338 QTC Calculation: 451 R Axis:   65  Text Interpretation: Sinus  tachycardia Otherwise normal ECG When compared with ECG of 26-Dec-2019 12:34, PREVIOUS ECG IS PRESENT Confirmed by Yolande Charleston [phone]) on 07/27/2024 6:00:41 PM  Radiology: No results found.  Procedures   Medications Ordered in the ED  aspirin  chewable tablet 162 mg (has no administration in time range)  oxyCODONE  (Oxy IR/ROXICODONE ) immediate release tablet 5 mg (has no administration in time range)                                   Medical Decision Making Amount and/or Complexity of Data Reviewed Labs: ordered. Radiology: ordered.   Patient is a 49 year old male who presents today for evaluation of forearm and calf swelling in the setting of drug use.  On initial assessment patient was noted to be hemodynamically stable and afebrile.  On the visit assessment patient was noted be resting comfortably no acute distress.  Physical examination without acute abnormalities.  No obvious swelling to bilateral upper or lower extremities.  Intact pulses throughout.  Extremities warm and well-perfused.  Upon chart review patient has been evaluated multiple times for evaluation of similar.  Prior vessel imaging without evidence of acute DVT.  I was notified that patient eloped.  Was able to discuss return precautions or AMA instructions.  Patient had laboratory evaluation as well as imaging studies that had not yet been performed.  Final diagnoses:  Right arm pain    ED Discharge Orders     None          Laurita Sieving, MD 07/27/24 7668    Patsey Lot, MD 07/30/24 1513  "

## 2024-07-27 NOTE — ED Triage Notes (Signed)
 Pt c/o left sided chest pain radiating to left arm and swelling to both arms that started about 30 mins agos.

## 2024-07-27 NOTE — ED Notes (Signed)
 Patient is refusing IV and labs. He only wants his pain medicine.
